# Patient Record
Sex: Female | Born: 1960 | Race: White | Hispanic: No | Marital: Married | Smoking: Former smoker
Health system: Southern US, Community
[De-identification: ages and names within clinical notes are randomized; demographics above are authoritative.]

## PROBLEM LIST (undated history)

## (undated) DIAGNOSIS — T8859XA Other complications of anesthesia, initial encounter: Secondary | ICD-10-CM

## (undated) DIAGNOSIS — K921 Melena: Secondary | ICD-10-CM

## (undated) DIAGNOSIS — T883XXA Malignant hyperthermia due to anesthesia, initial encounter: Secondary | ICD-10-CM

## (undated) DIAGNOSIS — G43909 Migraine, unspecified, not intractable, without status migrainosus: Secondary | ICD-10-CM

## (undated) DIAGNOSIS — Z8489 Family history of other specified conditions: Secondary | ICD-10-CM

## (undated) DIAGNOSIS — Z860100 Personal history of colon polyps, unspecified: Secondary | ICD-10-CM

## (undated) DIAGNOSIS — E041 Nontoxic single thyroid nodule: Secondary | ICD-10-CM

## (undated) DIAGNOSIS — T4145XA Adverse effect of unspecified anesthetic, initial encounter: Secondary | ICD-10-CM

## (undated) DIAGNOSIS — K219 Gastro-esophageal reflux disease without esophagitis: Secondary | ICD-10-CM

## (undated) DIAGNOSIS — C801 Malignant (primary) neoplasm, unspecified: Secondary | ICD-10-CM

## (undated) DIAGNOSIS — F17210 Nicotine dependence, cigarettes, uncomplicated: Secondary | ICD-10-CM

## (undated) DIAGNOSIS — Z87442 Personal history of urinary calculi: Secondary | ICD-10-CM

## (undated) DIAGNOSIS — I10 Essential (primary) hypertension: Secondary | ICD-10-CM

## (undated) DIAGNOSIS — E785 Hyperlipidemia, unspecified: Secondary | ICD-10-CM

## (undated) DIAGNOSIS — E559 Vitamin D deficiency, unspecified: Secondary | ICD-10-CM

## (undated) HISTORY — DX: Melena: K92.1

## (undated) HISTORY — DX: Gastro-esophageal reflux disease without esophagitis: K21.9

## (undated) HISTORY — DX: Vitamin D deficiency, unspecified: E55.9

## (undated) HISTORY — DX: Migraine, unspecified, not intractable, without status migrainosus: G43.909

## (undated) HISTORY — DX: Hyperlipidemia, unspecified: E78.5

## (undated) HISTORY — PX: CHOLECYSTECTOMY: SHX55

## (undated) HISTORY — PX: ESOPHAGOGASTRODUODENOSCOPY: SHX1529

## (undated) HISTORY — PX: LAPAROSCOPIC CHOLECYSTECTOMY: SUR755

---

## 1981-01-08 HISTORY — PX: APPENDECTOMY: SHX54

## 1990-01-08 DIAGNOSIS — C569 Malignant neoplasm of unspecified ovary: Secondary | ICD-10-CM

## 1990-01-08 DIAGNOSIS — C801 Malignant (primary) neoplasm, unspecified: Secondary | ICD-10-CM

## 1990-01-08 HISTORY — DX: Malignant (primary) neoplasm, unspecified: C80.1

## 1990-01-08 HISTORY — DX: Malignant neoplasm of unspecified ovary: C56.9

## 1990-01-08 HISTORY — PX: TOTAL ABDOMINAL HYSTERECTOMY: SHX209

## 2004-01-09 HISTORY — PX: OTHER SURGICAL HISTORY: SHX169

## 2005-04-04 ENCOUNTER — Encounter: Admission: RE | Admit: 2005-04-04 | Discharge: 2005-04-04 | Payer: Self-pay | Admitting: Internal Medicine

## 2005-04-05 ENCOUNTER — Encounter: Admission: RE | Admit: 2005-04-05 | Discharge: 2005-04-05 | Payer: Self-pay | Admitting: Internal Medicine

## 2005-06-26 ENCOUNTER — Other Ambulatory Visit: Admission: RE | Admit: 2005-06-26 | Discharge: 2005-06-26 | Payer: Self-pay | Admitting: Internal Medicine

## 2005-09-01 ENCOUNTER — Emergency Department (HOSPITAL_COMMUNITY): Admission: EM | Admit: 2005-09-01 | Discharge: 2005-09-01 | Payer: Self-pay | Admitting: Emergency Medicine

## 2006-01-08 HISTORY — PX: COLONOSCOPY: SHX174

## 2006-07-31 ENCOUNTER — Encounter: Admission: RE | Admit: 2006-07-31 | Discharge: 2006-07-31 | Payer: Self-pay | Admitting: *Deleted

## 2006-08-29 ENCOUNTER — Ambulatory Visit (HOSPITAL_COMMUNITY): Admission: RE | Admit: 2006-08-29 | Discharge: 2006-08-29 | Payer: Self-pay | Admitting: Gastroenterology

## 2006-08-29 ENCOUNTER — Encounter (INDEPENDENT_AMBULATORY_CARE_PROVIDER_SITE_OTHER): Payer: Self-pay | Admitting: Gastroenterology

## 2010-01-29 ENCOUNTER — Encounter: Payer: Self-pay | Admitting: Internal Medicine

## 2010-05-23 NOTE — Op Note (Signed)
Brenda [name], GLADD NO.:  [id number]   MEDICAL RECORD NO.:  [id number]          PATIENT TYPE:  AMB   LOCATION:  ENDO                         FACILITY:  [facility]   PHYSICIAN:  Petra Kuba, M.D.    DATE OF BIRTH:  04/19/1960   DATE OF PROCEDURE:  08/29/2006  DATE OF DISCHARGE:                               OPERATIVE REPORT   PROCEDURE:  Colonoscopy and biopsy.   INDICATIONS:  Patient with a questionable history of colon polyps,  thought due by primary care for repeat screening.  Consent was signed  after risks, benefits, methods, options thoroughly discussed in the  office and before any sedation.   MEDICINES USED:  Fentanyl 100 mcg, Versed 7.5 mg.   PROCEDURE:  Rectal inspection is pertinent for external hemorrhoids,  small.  Digital exam was negative.  The video pediatric colonoscope was  inserted and with some difficulty due to a tortuous, looping colon with  rolling her back and abdominal pressure was able to be advanced to the  cecum.  No abnormalities were seen on insertion.  The scope was inserted  a short way into the terminal ileum, which was normal.  Photo  documentation was obtained.  The scope was slowly withdrawn.  The prep  was adequate.  There was some liquid stool that required washing and  suctioning.  On slow withdrawal through the colon the cecum, ascending,  transverse, descending and majority the sigmoid were normal.  In the  rectal and distal sigmoid were a few tiny hyperplastic-appearing polyps,  which were cold-biopsied and put in the same container.  Anorectal pull-  through and retroflexion confirmed some small hemorrhoids.  The scope  was straightened and readvanced short way up the left side of the colon.  Air was suctioned, the scope removed.  The patient tolerated the  procedure well.  There was no obvious immediate complication.   ENDOSCOPIC DIAGNOSES:  1. Internal-external small hemorrhoids.  2. Few tiny rectal, sigmoid  hyperplastic-appearing polyps, cold      biopsied.  3. Otherwise within normal limits to the cecum and terminal ileum.   PLAN:  Will await pathology to determine future colonic screening.  We  will need to discuss her previous colonoscopy report and polyp history  with the primary care doctor since her colonoscopy just arrived and did  not show any polyps, based on the one in [place].  Otherwise probably  proceed with colon screening at age 23 and continue workup with an EGD  for her nausea.           ______________________________  Petra Kuba, M.D.     MEM/MEDQ  D:  08/29/2006  T:  08/30/2006  Job:  [id number]   cc:   Bess Kinds, MD

## 2010-05-23 NOTE — Op Note (Signed)
NAMEWILHEMINA, Brenda [name] NO.:  [id number]   MEDICAL RECORD NO.:  [id number]          PATIENT TYPE:  AMB   LOCATION:  ENDO                         FACILITY:  [facility]   PHYSICIAN:  Petra Kuba, M.D.    DATE OF BIRTH:  10-22-1960   DATE OF PROCEDURE:  08/29/2006  DATE OF DISCHARGE:                               OPERATIVE REPORT   PROCEDURE:  EGD with biopsy.   INDICATIONS:  Nausea, some reflux.  Consent was signed after risks,  benefits, methods, and options were thoroughly discussed in the office  and before any medications were given.   Additional medicines for this procedure since it followed the  colonoscopy 2.5 of Versed only.   DESCRIPTION OF PROCEDURE:  The video endoscope was inserted by direct  vision.  The esophagus was normal.  She did have a small to possibly  medium size hiatal hernia.  The scope passed into the stomach.  She had  some difficulty holding in air, advanced through a normal pylorus into a  normal duodenal bulb and around the C-loop to a normal second portion of  the duodenum.  The scope was withdrawn back to the bulb and a good look  there ruled out ulcers in that location.  The scope was withdrawn back  to the stomach.  She had a moderate patch of gastritis in the antrum  which was cold biopsied a few times but otherwise the stomach looked  good on straight and retroflexed visualization including a good look at  the cardia, fundus, angularis, lesser and greater curve. Complete  visualization of the stomach was difficult due to her inability to  completely hold the air. A few proximal biopsies were obtained to rule  out Helicobacter as well.  The scope was then slowly withdrawn again  confirming a normal esophagus.  The scope was removed.  The patient  tolerated the procedure well.  There was no obvious immediate  complication.   ENDOSCOPIC DIAGNOSES:  1. Small to medium size hiatal hernia.  2. Patch of antritis status post biopsy and  proximal gastric biopsies      done to rule out Helicobacter.  3. Otherwise normal EGD.   PLAN:  Await pathology. May need to upgrade pump inhibitors and possibly  even a trial of Carafate. May want to treat her reflux more aggressively  to see if that helps her nausea and further workup and plans either by  coming back to me or per primary care.           ______________________________  Petra Kuba, M.D.     MEM/MEDQ  D:  08/29/2006  T:  08/30/2006  Job:  [id number]   cc:   Annell Greening

## 2010-12-11 ENCOUNTER — Ambulatory Visit
Admission: RE | Admit: 2010-12-11 | Discharge: 2010-12-11 | Disposition: A | Discharge status: Home / Self Care | Payer: BC Managed Care – PPO | Source: Ambulatory Visit | Attending: Family Medicine | Admitting: Family Medicine

## 2010-12-11 ENCOUNTER — Other Ambulatory Visit: Payer: Self-pay | Admitting: Family Medicine

## 2010-12-11 DIAGNOSIS — M549 Dorsalgia, unspecified: Secondary | ICD-10-CM

## 2011-01-23 ENCOUNTER — Other Ambulatory Visit: Payer: Self-pay | Admitting: Family Medicine

## 2011-01-23 DIAGNOSIS — Z1231 Encounter for screening mammogram for malignant neoplasm of breast: Secondary | ICD-10-CM

## 2011-02-13 ENCOUNTER — Ambulatory Visit
Admission: RE | Admit: 2011-02-13 | Discharge: 2011-02-13 | Disposition: A | Discharge status: Home / Self Care | Payer: BC Managed Care – PPO | Source: Ambulatory Visit | Attending: Family Medicine | Admitting: Family Medicine

## 2011-02-13 DIAGNOSIS — Z1231 Encounter for screening mammogram for malignant neoplasm of breast: Secondary | ICD-10-CM

## 2011-11-08 ENCOUNTER — Other Ambulatory Visit: Payer: Self-pay | Admitting: Family Medicine

## 2011-11-08 ENCOUNTER — Ambulatory Visit
Admission: RE | Admit: 2011-11-08 | Discharge: 2011-11-08 | Disposition: A | Discharge status: Home / Self Care | Payer: BC Managed Care – PPO | Source: Ambulatory Visit | Attending: Family Medicine | Admitting: Family Medicine

## 2011-11-08 DIAGNOSIS — R0789 Other chest pain: Secondary | ICD-10-CM

## 2011-11-08 DIAGNOSIS — N644 Mastodynia: Secondary | ICD-10-CM

## 2011-11-08 DIAGNOSIS — R05 Cough: Secondary | ICD-10-CM

## 2011-11-14 ENCOUNTER — Ambulatory Visit
Admission: RE | Admit: 2011-11-14 | Discharge: 2011-11-14 | Disposition: A | Discharge status: Home / Self Care | Payer: BC Managed Care – PPO | Source: Ambulatory Visit | Attending: Family Medicine | Admitting: Family Medicine

## 2011-11-14 DIAGNOSIS — N644 Mastodynia: Secondary | ICD-10-CM

## 2012-05-21 ENCOUNTER — Other Ambulatory Visit: Payer: Self-pay | Admitting: Family Medicine

## 2012-05-21 ENCOUNTER — Other Ambulatory Visit: Payer: Self-pay

## 2012-05-21 DIAGNOSIS — E8940 Asymptomatic postprocedural ovarian failure: Secondary | ICD-10-CM

## 2012-05-21 DIAGNOSIS — Z90710 Acquired absence of both cervix and uterus: Secondary | ICD-10-CM

## 2012-05-29 ENCOUNTER — Other Ambulatory Visit: Payer: BC Managed Care – PPO

## 2012-07-03 ENCOUNTER — Other Ambulatory Visit: Payer: Self-pay

## 2012-07-03 DIAGNOSIS — Z1231 Encounter for screening mammogram for malignant neoplasm of breast: Secondary | ICD-10-CM

## 2012-07-31 ENCOUNTER — Ambulatory Visit
Admission: RE | Admit: 2012-07-31 | Discharge: 2012-07-31 | Disposition: A | Discharge status: Home / Self Care | Payer: BC Managed Care – PPO | Source: Ambulatory Visit | Attending: Family Medicine | Admitting: Family Medicine

## 2012-07-31 ENCOUNTER — Ambulatory Visit
Admission: RE | Admit: 2012-07-31 | Discharge: 2012-07-31 | Disposition: A | Discharge status: Home / Self Care | Payer: BC Managed Care – PPO | Source: Ambulatory Visit

## 2012-07-31 DIAGNOSIS — Z1231 Encounter for screening mammogram for malignant neoplasm of breast: Secondary | ICD-10-CM

## 2012-07-31 DIAGNOSIS — E8940 Asymptomatic postprocedural ovarian failure: Secondary | ICD-10-CM

## 2012-07-31 DIAGNOSIS — Z90710 Acquired absence of both cervix and uterus: Secondary | ICD-10-CM

## 2013-03-30 ENCOUNTER — Other Ambulatory Visit: Payer: BC Managed Care – PPO

## 2013-03-30 ENCOUNTER — Other Ambulatory Visit: Payer: Self-pay | Admitting: Family Medicine

## 2013-03-30 ENCOUNTER — Ambulatory Visit
Admission: RE | Admit: 2013-03-30 | Discharge: 2013-03-30 | Disposition: A | Discharge status: Home / Self Care | Payer: BC Managed Care – PPO | Source: Ambulatory Visit | Attending: Family Medicine | Admitting: Family Medicine

## 2013-03-30 DIAGNOSIS — R319 Hematuria, unspecified: Secondary | ICD-10-CM

## 2013-03-30 DIAGNOSIS — R109 Unspecified abdominal pain: Secondary | ICD-10-CM

## 2013-05-26 ENCOUNTER — Other Ambulatory Visit: Payer: Self-pay | Admitting: Family Medicine

## 2013-05-26 DIAGNOSIS — Z1231 Encounter for screening mammogram for malignant neoplasm of breast: Secondary | ICD-10-CM

## 2013-06-02 ENCOUNTER — Other Ambulatory Visit: Payer: Self-pay | Admitting: Family Medicine

## 2013-06-02 DIAGNOSIS — R0989 Other specified symptoms and signs involving the circulatory and respiratory systems: Secondary | ICD-10-CM

## 2013-06-04 ENCOUNTER — Encounter (INDEPENDENT_AMBULATORY_CARE_PROVIDER_SITE_OTHER): Payer: Self-pay

## 2013-06-04 ENCOUNTER — Ambulatory Visit
Admission: RE | Admit: 2013-06-04 | Discharge: 2013-06-04 | Disposition: A | Discharge status: Home / Self Care | Payer: BC Managed Care – PPO | Source: Ambulatory Visit | Attending: Family Medicine | Admitting: Family Medicine

## 2013-06-04 DIAGNOSIS — R0989 Other specified symptoms and signs involving the circulatory and respiratory systems: Secondary | ICD-10-CM

## 2013-07-02 ENCOUNTER — Encounter: Payer: Self-pay | Admitting: *Deleted

## 2013-07-02 DIAGNOSIS — E785 Hyperlipidemia, unspecified: Secondary | ICD-10-CM

## 2013-07-02 DIAGNOSIS — K219 Gastro-esophageal reflux disease without esophagitis: Secondary | ICD-10-CM | POA: Insufficient documentation

## 2013-07-02 DIAGNOSIS — E559 Vitamin D deficiency, unspecified: Secondary | ICD-10-CM | POA: Insufficient documentation

## 2013-08-03 ENCOUNTER — Ambulatory Visit
Admission: RE | Admit: 2013-08-03 | Discharge: 2013-08-03 | Disposition: A | Discharge status: Home / Self Care | Payer: BC Managed Care – PPO | Source: Ambulatory Visit | Attending: Family Medicine | Admitting: Family Medicine

## 2013-08-03 ENCOUNTER — Encounter (INDEPENDENT_AMBULATORY_CARE_PROVIDER_SITE_OTHER): Payer: Self-pay

## 2013-08-03 DIAGNOSIS — Z1231 Encounter for screening mammogram for malignant neoplasm of breast: Secondary | ICD-10-CM

## 2014-09-21 ENCOUNTER — Other Ambulatory Visit: Payer: Self-pay | Admitting: Family Medicine

## 2014-09-21 DIAGNOSIS — Z1231 Encounter for screening mammogram for malignant neoplasm of breast: Secondary | ICD-10-CM

## 2014-09-21 DIAGNOSIS — M8580 Other specified disorders of bone density and structure, unspecified site: Secondary | ICD-10-CM

## 2014-10-27 ENCOUNTER — Ambulatory Visit: Payer: BC Managed Care – PPO

## 2014-10-27 ENCOUNTER — Other Ambulatory Visit: Payer: BC Managed Care – PPO

## 2014-11-23 ENCOUNTER — Ambulatory Visit
Admission: RE | Admit: 2014-11-23 | Discharge: 2014-11-23 | Disposition: A | Discharge status: Home / Self Care | Payer: BC Managed Care – PPO | Source: Ambulatory Visit | Attending: Family Medicine | Admitting: Family Medicine

## 2014-11-23 DIAGNOSIS — M8580 Other specified disorders of bone density and structure, unspecified site: Secondary | ICD-10-CM

## 2014-11-23 DIAGNOSIS — Z1231 Encounter for screening mammogram for malignant neoplasm of breast: Secondary | ICD-10-CM

## 2014-11-24 ENCOUNTER — Other Ambulatory Visit: Payer: BC Managed Care – PPO

## 2015-10-26 ENCOUNTER — Other Ambulatory Visit: Payer: Self-pay | Admitting: Dermatology

## 2015-10-26 DIAGNOSIS — C4491 Basal cell carcinoma of skin, unspecified: Secondary | ICD-10-CM

## 2015-10-26 HISTORY — DX: Basal cell carcinoma of skin, unspecified: C44.91

## 2016-07-04 ENCOUNTER — Telehealth: Payer: Self-pay

## 2016-07-06 NOTE — Telephone Encounter (Signed)
error 

## 2016-09-25 ENCOUNTER — Other Ambulatory Visit: Payer: Self-pay | Admitting: Nurse Practitioner

## 2016-09-25 DIAGNOSIS — M8580 Other specified disorders of bone density and structure, unspecified site: Secondary | ICD-10-CM

## 2016-09-25 DIAGNOSIS — Z1231 Encounter for screening mammogram for malignant neoplasm of breast: Secondary | ICD-10-CM

## 2016-11-26 ENCOUNTER — Ambulatory Visit
Admission: RE | Admit: 2016-11-26 | Discharge: 2016-11-26 | Disposition: A | Discharge status: Home / Self Care | Payer: BC Managed Care – PPO | Source: Ambulatory Visit | Attending: Nurse Practitioner | Admitting: Nurse Practitioner

## 2016-11-26 DIAGNOSIS — Z1231 Encounter for screening mammogram for malignant neoplasm of breast: Secondary | ICD-10-CM

## 2016-11-26 DIAGNOSIS — M8580 Other specified disorders of bone density and structure, unspecified site: Secondary | ICD-10-CM

## 2016-11-28 ENCOUNTER — Other Ambulatory Visit: Payer: Self-pay | Admitting: Nurse Practitioner

## 2016-11-28 DIAGNOSIS — R928 Other abnormal and inconclusive findings on diagnostic imaging of breast: Secondary | ICD-10-CM

## 2016-12-06 ENCOUNTER — Ambulatory Visit
Admission: RE | Admit: 2016-12-06 | Discharge: 2016-12-06 | Disposition: A | Discharge status: Home / Self Care | Payer: BC Managed Care – PPO | Source: Ambulatory Visit | Attending: Nurse Practitioner | Admitting: Nurse Practitioner

## 2016-12-06 DIAGNOSIS — R928 Other abnormal and inconclusive findings on diagnostic imaging of breast: Secondary | ICD-10-CM

## 2017-03-18 ENCOUNTER — Other Ambulatory Visit: Payer: Self-pay | Admitting: Gastroenterology

## 2017-03-26 ENCOUNTER — Other Ambulatory Visit: Payer: Self-pay

## 2017-03-26 ENCOUNTER — Encounter (HOSPITAL_COMMUNITY): Payer: Self-pay | Admitting: *Deleted

## 2017-03-26 NOTE — Progress Notes (Signed)
Patient stated having diarrhea at time of pre op call x 1 day, stated having lots of grandchildren around thought she had "tummy bug", patient instructed to call dr Watt Climes if diarrhea lasted more than 2 days.

## 2017-03-26 NOTE — Progress Notes (Signed)
Spoke with Brenda [name] surgery scheduling malignant hyperthermia icon added to case by Brenda [name], spoke with dr Rogelio Seen Brenda [name] anesthesia, and notified patient with family history of mother, daughter and niece diagnosed with malignant hyperthermia, patient test 2006 inconclusive 2006, patient ok for colonscopy at 1100 04-08-17 per dr Rogelio Seen Brenda [name].

## 2017-04-08 ENCOUNTER — Other Ambulatory Visit: Payer: Self-pay | Admitting: Gastroenterology

## 2017-04-08 ENCOUNTER — Other Ambulatory Visit: Payer: Self-pay

## 2017-04-08 ENCOUNTER — Encounter (HOSPITAL_COMMUNITY)
Admission: RE | Disposition: A | Discharge status: Home / Self Care | Payer: Self-pay | Source: Ambulatory Visit | Attending: Gastroenterology

## 2017-04-08 ENCOUNTER — Ambulatory Visit (HOSPITAL_COMMUNITY): Payer: BC Managed Care – PPO | Admitting: Certified Registered Nurse Anesthetist

## 2017-04-08 ENCOUNTER — Ambulatory Visit (HOSPITAL_COMMUNITY)
Admission: RE | Admit: 2017-04-08 | Discharge: 2017-04-08 | Disposition: A | Discharge status: Home / Self Care | Payer: BC Managed Care – PPO | Source: Ambulatory Visit | Attending: Gastroenterology | Admitting: Gastroenterology

## 2017-04-08 ENCOUNTER — Encounter (HOSPITAL_COMMUNITY): Payer: Self-pay

## 2017-04-08 DIAGNOSIS — Z87891 Personal history of nicotine dependence: Secondary | ICD-10-CM | POA: Diagnosis not present

## 2017-04-08 DIAGNOSIS — Z8249 Family history of ischemic heart disease and other diseases of the circulatory system: Secondary | ICD-10-CM | POA: Diagnosis not present

## 2017-04-08 DIAGNOSIS — Z8543 Personal history of malignant neoplasm of ovary: Secondary | ICD-10-CM | POA: Insufficient documentation

## 2017-04-08 DIAGNOSIS — Z1211 Encounter for screening for malignant neoplasm of colon: Secondary | ICD-10-CM | POA: Insufficient documentation

## 2017-04-08 DIAGNOSIS — Z79899 Other long term (current) drug therapy: Secondary | ICD-10-CM | POA: Diagnosis not present

## 2017-04-08 DIAGNOSIS — Z791 Long term (current) use of non-steroidal anti-inflammatories (NSAID): Secondary | ICD-10-CM | POA: Insufficient documentation

## 2017-04-08 DIAGNOSIS — K219 Gastro-esophageal reflux disease without esophagitis: Secondary | ICD-10-CM | POA: Insufficient documentation

## 2017-04-08 DIAGNOSIS — K635 Polyp of colon: Secondary | ICD-10-CM | POA: Insufficient documentation

## 2017-04-08 HISTORY — DX: Adverse effect of unspecified anesthetic, initial encounter: T41.45XA

## 2017-04-08 HISTORY — DX: Malignant hyperthermia due to anesthesia, initial encounter: T88.3XXA

## 2017-04-08 HISTORY — DX: Other complications of anesthesia, initial encounter: T88.59XA

## 2017-04-08 HISTORY — PX: COLONOSCOPY WITH PROPOFOL: SHX5780

## 2017-04-08 HISTORY — DX: Family history of other specified conditions: Z84.89

## 2017-04-08 HISTORY — DX: Personal history of urinary calculi: Z87.442

## 2017-04-08 HISTORY — DX: Malignant (primary) neoplasm, unspecified: C80.1

## 2017-04-08 SURGERY — COLONOSCOPY WITH PROPOFOL
Anesthesia: Monitor Anesthesia Care

## 2017-04-08 MED ORDER — ONDANSETRON HCL 4 MG/2ML IJ SOLN
INTRAMUSCULAR | Status: DC | PRN
  Administered 2017-04-08: 4 mg via INTRAVENOUS

## 2017-04-08 MED ORDER — PROPOFOL 500 MG/50ML IV EMUL
INTRAVENOUS | Status: DC | PRN
  Administered 2017-04-08: 140 ug/kg/min via INTRAVENOUS

## 2017-04-08 MED ORDER — PROPOFOL 10 MG/ML IV BOLUS
INTRAVENOUS | Status: AC
  Filled 2017-04-08: qty 40

## 2017-04-08 MED ORDER — LACTATED RINGERS IV SOLN
INTRAVENOUS | Status: DC
  Administered 2017-04-08: 1000 mL via INTRAVENOUS

## 2017-04-08 MED ORDER — PROPOFOL 10 MG/ML IV BOLUS
INTRAVENOUS | Status: AC
  Filled 2017-04-08: qty 20

## 2017-04-08 MED ORDER — LIDOCAINE 2% (20 MG/ML) 5 ML SYRINGE
INTRAMUSCULAR | Status: DC | PRN
  Administered 2017-04-08: 100 mg via INTRAVENOUS

## 2017-04-08 MED ORDER — PROPOFOL 10 MG/ML IV BOLUS
INTRAVENOUS | Status: DC | PRN
  Administered 2017-04-08 (×3): 20 mg via INTRAVENOUS

## 2017-04-08 MED ORDER — SODIUM CHLORIDE 0.9 % IV SOLN: INTRAVENOUS | Status: DC

## 2017-04-08 SURGICAL SUPPLY — 22 items

## 2017-04-08 NOTE — Transfer of Care (Signed)
Immediate Anesthesia Transfer of Care Note  Patient: Brenda [name]  Procedure(s) Performed: COLONOSCOPY WITH PROPOFOL (N/A )  Patient Location: Endoscopy Unit  Anesthesia Type:MAC  Level of Consciousness: awake  Airway & Oxygen Therapy: Patient Spontanous Breathing and Patient connected to face mask oxygen  Post-op Assessment: Report given to RN and Post -op Vital signs reviewed and stable  Post vital signs: Reviewed and stable  Last Vitals:  Vitals Value Taken Time  BP    Temp    Pulse    Resp    SpO2      Last Pain:  Vitals:   04/08/17 1000  TempSrc: Oral  PainSc: 0-No pain         Complications: No apparent anesthesia complications

## 2017-04-08 NOTE — Op Note (Signed)
[facility] Patient Name: Brenda [name] Procedure Date: 04/08/2017 MRN: [id number] Attending MD: Clarene Essex , MD Date of Birth: 1960-05-03 CSN: [id number] Age: 57 Admit Type: Outpatient Procedure:                Colonoscopy Indications:              Screening for colorectal malignant neoplasm, Last                            colonoscopy: August 2008 Providers:                Clarene Essex, MD, Presley Raddle, RN, Elspeth Cho                            Tech., Technician, Danley Danker, CRNA Referring MD:              Medicines:                Propofol total dose 375 mg IV, 100 mg IV lidocaine Complications:            No immediate complications. Estimated Blood Loss:     Estimated blood loss: none. Procedure:                Pre-Anesthesia Assessment:                           - Prior to the procedure, a History and Physical                            was performed, and patient medications and                            allergies were reviewed. The patient's tolerance of                            previous anesthesia was also reviewed. The risks                            and benefits of the procedure and the sedation                            options and risks were discussed with the patient.                            All questions were answered, and informed consent                            was obtained. Prior Anticoagulants: The patient has                            taken no previous anticoagulant or antiplatelet                            agents. ASA Grade Assessment: II - A patient with  mild systemic disease. After reviewing the risks                            and benefits, the patient was deemed in                            satisfactory condition to undergo the procedure.                           After obtaining informed consent, the colonoscope                            was passed under direct vision. Throughout the                     procedure, the patient's blood pressure, pulse, and                            oxygen saturations were monitored continuously. The                            EC-[id number] ([id number]) scope was introduced through                            the anus and advanced to the the terminal ileum.                            The terminal ileum, ileocecal valve, appendiceal                            orifice, and rectum were photographed. The                            colonoscopy was performed without difficulty. The                            patient tolerated the procedure well. The quality                            of the bowel preparation was adequate. Scope In: 12:23:47 PM Scope Out: 19:37:90 PM Scope Withdrawal Time: 0 hours 18 minutes 29 seconds  Total Procedure Duration: 0 hours 23 minutes 9 seconds  Findings:      External and internal hemorrhoids were found during retroflexion, during       perianal exam and during digital exam. The hemorrhoids were small.      Four semi-sessile polyps were found in the mid sigmoid colon and distal       sigmoid colon. The polyps were diminutive in size. These were biopsied       with a cold forceps for histology.      The terminal ileum appeared normal.      The exam was otherwise without abnormality. Impression:               - External and internal hemorrhoids.                           -  Four diminutive polyps in the mid sigmoid colon                            and in the distal sigmoid colon. Biopsied.                           - The examined portion of the ileum was normal.                           - The examination was otherwise normal. Moderate Sedation:      N/A- Per Anesthesia Care Recommendation:           - Patient has a contact number available for                            emergencies. The signs and symptoms of potential                            delayed complications were discussed with the                             patient. Return to normal activities tomorrow.                            Written discharge instructions were provided to the                            patient.                           - Soft diet today.                           - Continue present medications.                           - Await pathology results.                           - Repeat colonoscopy in 5-10 years for surveillance                            based on pathology results.                           - Return to GI office PRN.                           - Telephone GI clinic for pathology results in 1                            week.                           - Telephone GI clinic if symptomatic PRN. Procedure Code(s):        --- Professional ---  45380, Colonoscopy, flexible; with biopsy, single                            or multiple Diagnosis Code(s):        --- Professional ---                           Z12.11, Encounter for screening for malignant                            neoplasm of colon                           D12.5, Benign neoplasm of sigmoid colon CPT copyright 2016 American [employer] Association. All rights reserved. The codes documented in this report are preliminary and upon coder review may  be revised to meet current compliance requirements. Clarene Essex, MD 04/08/2017 12:52:25 PM This report has been signed electronically. Number of Addenda: 0

## 2017-04-08 NOTE — Anesthesia Procedure Notes (Signed)
Date/Time: 04/08/2017 12:14 PM Performed by: Sharlette Dense, CRNA Oxygen Delivery Method: Simple face mask

## 2017-04-08 NOTE — Discharge Instructions (Signed)
Call if question or problem and follow-up as needed otherwise call for biopsy report in 1 week  YOU HAD AN ENDOSCOPIC PROCEDURE TODAY: Refer to the procedure report and other information in the discharge instructions given to you for any specific questions about what was found during the examination. If this information does not answer your questions, please call [facility] office at [phone] to clarify.   YOU SHOULD EXPECT: Some feelings of bloating in the abdomen. Passage of more gas than usual. Walking can help get rid of the air that was put into your GI tract during the procedure and reduce the bloating. If you had a lower endoscopy (such as a colonoscopy or flexible sigmoidoscopy) you may notice spotting of blood in your stool or on the toilet paper. Some abdominal soreness may be present for a day or two, also.  DIET: Your first meal following the procedure should be a light meal and then it is ok to progress to your normal diet. A half-sandwich or bowl of soup is an example of a good first meal. Heavy or fried foods are harder to digest and may make you feel nauseous or bloated. Drink plenty of fluids but you should avoid alcoholic beverages for 24 hours. If you had a esophageal dilation, please see attached instructions for diet.   ACTIVITY: Your care partner should take you home directly after the procedure. You should plan to take it easy, moving slowly for the rest of the day. You can resume normal activity the day after the procedure however YOU SHOULD NOT DRIVE, use power tools, machinery or perform tasks that involve climbing or major physical exertion for 24 hours (because of the sedation medicines used during the test).   SYMPTOMS TO REPORT IMMEDIATELY: A gastroenterologist can be reached at any hour. Please call [phone]  for any of the following symptoms:  Following lower endoscopy (colonoscopy, flexible sigmoidoscopy) Excessive amounts of blood in the stool  Significant  tenderness, worsening of abdominal pains  Swelling of the abdomen that is new, acute  Fever of 100 or higher  Following upper endoscopy (EGD, EUS, ERCP, esophageal dilation) Vomiting of blood or coffee ground material  New, significant abdominal pain  New, significant chest pain or pain under the shoulder blades  Painful or persistently difficult swallowing  New shortness of breath  Black, tarry-looking or red, bloody stools  FOLLOW UP:  If any biopsies were taken you will be contacted by phone or by letter within the next 1-3 weeks. Call [phone]  if you have not heard about the biopsies in 3 weeks.  Please also call with any specific questions about appointments or follow up tests.

## 2017-04-08 NOTE — Anesthesia Preprocedure Evaluation (Signed)
Anesthesia Evaluation  Patient identified by MRN, date of birth, ID band Patient awake    Reviewed: Allergy & Precautions, H&P , NPO status , Patient's Chart, lab work & pertinent test results  History of Anesthesia Complications (+) MALIGNANT HYPERTHERMIA, Family history of anesthesia reaction and history of anesthetic complications  Airway Mallampati: II   Neck ROM: full    Dental   Pulmonary former smoker,    breath sounds clear to auscultation       Cardiovascular negative cardio ROS   Rhythm:regular Rate:Normal     Neuro/Psych  Headaches,    GI/Hepatic GERD  ,  Endo/Other    Renal/GU      Musculoskeletal   Abdominal   Peds  Hematology   Anesthesia Other Findings Family Hx of MH. Pt was tested 2006, results inconclusive.  Reproductive/Obstetrics                             Anesthesia Physical Anesthesia Plan  ASA: II  Anesthesia Plan: MAC   Post-op Pain Management:    Induction: Intravenous  PONV Risk Score and Plan: 2 and Propofol infusion, Ondansetron and Treatment may vary due to age or medical condition  Airway Management Planned: Nasal Cannula  Additional Equipment:   Intra-op Plan:   Post-operative Plan:   Informed Consent: I have reviewed the patients History and Physical, chart, labs and discussed the procedure including the risks, benefits and alternatives for the proposed anesthesia with the patient or authorized representative who has indicated his/her understanding and acceptance.     Plan Discussed with: CRNA, Anesthesiologist and Surgeon  Anesthesia Plan Comments:         Anesthesia Quick Evaluation

## 2017-04-08 NOTE — Progress Notes (Signed)
Brenda [name] 12:05 PM  Subjective: Patient without any GI complaints and her history was reviewed and her previous procedure reviewed  Objective: Vital signs stable afebrile no acute distress exam please see preassessment evaluation  Assessment: Colon cancer screening  Plan: Okay to proceed with colonoscopy with anesthesia assistance  [facility]  Pager [phone] After 5PM or if no answer call [phone]

## 2017-04-09 NOTE — Anesthesia Postprocedure Evaluation (Signed)
Anesthesia Post Note  Patient: Brenda [name]  Procedure(s) Performed: COLONOSCOPY WITH PROPOFOL (N/A )     Patient location during evaluation: Endoscopy Anesthesia Type: MAC Level of consciousness: awake and alert Pain management: pain level controlled Vital Signs Assessment: post-procedure vital signs reviewed and stable Respiratory status: spontaneous breathing, nonlabored ventilation, respiratory function stable and patient connected to nasal cannula oxygen Cardiovascular status: blood pressure returned to baseline and stable Postop Assessment: no apparent nausea or vomiting Anesthetic complications: no    Last Vitals:  Vitals:   04/08/17 1300 04/08/17 1310  BP: 111/63 124/72  Pulse: (!) 57 (!) 54  Resp: 20 12  Temp: (!) 36.3 C   SpO2: 100% 98%    Last Pain:  Vitals:   04/08/17 1310  TempSrc:   PainSc: 0-No pain                 Lora Chavers S

## 2017-12-02 ENCOUNTER — Other Ambulatory Visit: Payer: Self-pay | Admitting: Nurse Practitioner

## 2017-12-02 ENCOUNTER — Other Ambulatory Visit: Payer: Self-pay | Admitting: Dermatology

## 2017-12-02 DIAGNOSIS — Z1231 Encounter for screening mammogram for malignant neoplasm of breast: Secondary | ICD-10-CM

## 2017-12-09 ENCOUNTER — Ambulatory Visit
Admission: RE | Admit: 2017-12-09 | Discharge: 2017-12-09 | Disposition: A | Discharge status: Home / Self Care | Payer: BC Managed Care – PPO | Source: Ambulatory Visit | Attending: Nurse Practitioner | Admitting: Nurse Practitioner

## 2017-12-09 DIAGNOSIS — Z1231 Encounter for screening mammogram for malignant neoplasm of breast: Secondary | ICD-10-CM

## 2018-11-14 ENCOUNTER — Other Ambulatory Visit: Payer: Self-pay | Admitting: Internal Medicine

## 2018-11-14 DIAGNOSIS — Z1231 Encounter for screening mammogram for malignant neoplasm of breast: Secondary | ICD-10-CM

## 2019-01-07 ENCOUNTER — Ambulatory Visit
Admission: RE | Admit: 2019-01-07 | Discharge: 2019-01-07 | Disposition: A | Discharge status: Home / Self Care | Payer: BC Managed Care – PPO | Source: Ambulatory Visit | Attending: Internal Medicine | Admitting: Internal Medicine

## 2019-01-07 ENCOUNTER — Other Ambulatory Visit: Payer: Self-pay

## 2019-01-07 DIAGNOSIS — Z1231 Encounter for screening mammogram for malignant neoplasm of breast: Secondary | ICD-10-CM

## 2019-04-27 ENCOUNTER — Other Ambulatory Visit: Payer: Self-pay

## 2019-04-27 ENCOUNTER — Encounter (INDEPENDENT_AMBULATORY_CARE_PROVIDER_SITE_OTHER): Payer: Self-pay

## 2019-04-27 ENCOUNTER — Ambulatory Visit: Payer: BC Managed Care – PPO | Admitting: Dermatology

## 2019-04-27 ENCOUNTER — Encounter: Payer: Self-pay | Admitting: Dermatology

## 2019-04-27 ENCOUNTER — Encounter: Payer: Self-pay | Admitting: *Deleted

## 2019-04-27 DIAGNOSIS — D492 Neoplasm of unspecified behavior of bone, soft tissue, and skin: Secondary | ICD-10-CM

## 2019-04-27 DIAGNOSIS — Z85828 Personal history of other malignant neoplasm of skin: Secondary | ICD-10-CM | POA: Diagnosis not present

## 2019-04-27 DIAGNOSIS — D485 Neoplasm of uncertain behavior of skin: Secondary | ICD-10-CM

## 2019-04-27 NOTE — Patient Instructions (Addendum)

## 2019-04-29 ENCOUNTER — Encounter: Payer: Self-pay | Admitting: Dermatology

## 2019-04-29 NOTE — Progress Notes (Signed)
   Follow-Up Visit   Subjective  Brenda [name] is a 59 y.o. female who presents for the following: Skin Problem (right outer hand-5 months- removed before-growing).  Growth Location: Right hand Duration: Several months Quality: Crease size Associated Signs/Symptoms: Modifying Factors: Recurrent Severity:  Timing: Context: History of skin cancer  The following portions of the chart were reviewed this encounter and updated as appropriate: Tobacco  Allergies  Meds  Problems  Med Hx  Surg Hx  Fam Hx      Objective  Well appearing patient in no apparent distress; mood and affect are within normal limits.  A focused examination was performed including arms, head, neck, back, hands. Relevant physical exam findings are noted in the Assessment and Plan.   Assessment & Plan  Neoplasm of skin Right Dorsal Hand  Skin / nail biopsy Type of biopsy: tangential   Anesthesia: the lesion was anesthetized in a standard fashion   Anesthetic:  1% lidocaine w/ epinephrine 1-100,000 local infiltration Instrument used: flexible razor blade   Hemostasis achieved with: ferric subsulfate   Outcome: patient tolerated procedure well   Post-procedure details: sterile dressing applied and wound care instructions given   Dressing type: petrolatum   Additional details:  Patient identified lesion of concern.  Lesion identified by physician.  Specimen 1 - Surgical pathology Differential Diagnosis: r/o wart Check Margins: No  Light C&D base.

## 2019-07-29 ENCOUNTER — Other Ambulatory Visit: Payer: Self-pay

## 2019-07-29 ENCOUNTER — Encounter: Payer: Self-pay | Admitting: Dermatology

## 2019-07-29 ENCOUNTER — Ambulatory Visit: Payer: BC Managed Care – PPO | Admitting: Dermatology

## 2019-07-29 DIAGNOSIS — D485 Neoplasm of uncertain behavior of skin: Secondary | ICD-10-CM

## 2019-07-29 NOTE — Patient Instructions (Signed)

## 2019-09-02 ENCOUNTER — Encounter: Payer: Self-pay | Admitting: Dermatology

## 2019-09-02 NOTE — Progress Notes (Signed)
   Follow-Up Visit   Subjective  Brenda [name] is a 59 y.o. female who presents for the following: Warts (right hand- " came back " ).  Growth right hand Location:  Duration:  Quality:  Associated Signs/Symptoms: Modifying Factors:  Severity:  Timing: Context: Patient strongly desires removal  Objective  Well appearing patient in no apparent distress; mood and affect are within normal limits.  A focused examination was performed including Arms and hands.. Relevant physical exam findings are noted in the Assessment and Plan.   Assessment & Plan    Neoplasm of uncertain behavior of skin Right Dorsal Hand  Skin / nail biopsy Type of biopsy: tangential   Informed consent: discussed and consent obtained   Timeout: patient name, date of birth, surgical site, and procedure verified   Procedure prep:  Patient was prepped and draped in usual sterile fashion Prep type:  Chlorhexidine Anesthesia: the lesion was anesthetized in a standard fashion   Anesthetic:  1% lidocaine w/ epinephrine 1-100,000 local infiltration Instrument used: flexible razor blade   Hemostasis achieved with: ferric subsulfate   Outcome: patient tolerated procedure well   Post-procedure details: wound care instructions given    Specimen 1 - Surgical pathology Differential Diagnosis: wart Check Margins: No  Multiple treatment options discussed including leaving lesion to be if it is stable.  This growth is very bothersome to choline and she strongly desires removal.  I explained that even with treatment of the base, recurrence is not rare and there is a risk of permanent scar.  She did want to proceed to have the lesion removed.     I, Brenda Monarch, MD, have reviewed all documentation for this visit.  The documentation on 09/02/19 for the exam, diagnosis, procedures, and orders are all accurate and complete.

## 2019-11-02 ENCOUNTER — Ambulatory Visit: Payer: BC Managed Care – PPO | Admitting: Dermatology

## 2019-11-02 ENCOUNTER — Other Ambulatory Visit: Payer: Self-pay

## 2019-11-02 ENCOUNTER — Encounter: Payer: Self-pay | Admitting: Dermatology

## 2019-11-02 DIAGNOSIS — B079 Viral wart, unspecified: Secondary | ICD-10-CM | POA: Diagnosis not present

## 2019-11-02 DIAGNOSIS — L309 Dermatitis, unspecified: Secondary | ICD-10-CM | POA: Diagnosis not present

## 2019-11-02 DIAGNOSIS — Z85828 Personal history of other malignant neoplasm of skin: Secondary | ICD-10-CM

## 2019-11-02 MED ORDER — HALCINONIDE 0.1 % EX CREA: 1.0000 "application " | TOPICAL_CREAM | Freq: Every day | CUTANEOUS | 0 refills | Status: DC

## 2019-11-04 ENCOUNTER — Encounter: Payer: Self-pay | Admitting: Dermatology

## 2019-11-04 NOTE — Progress Notes (Signed)
   Follow-Up Visit   Subjective  Brenda [name] is a 59 y.o. female who presents for the following: Follow-up (right hand pathology showed wart. Lesion is coming back. also check spot left leg dark area. Started out as bug bite wont go away. ).  Wart Location:  Duration:  Quality:  Associated Signs/Symptoms: Modifying Factors:  Severity:  Timing: Context:   Objective  Well appearing patient in no apparent distress; mood and affect are within normal limits.  A focused examination was performed including Head, neck, arms, legs.. Relevant physical exam findings are noted in the Assessment and Plan.   Assessment & Plan    History of basal cell carcinoma (BCC) of skin Left Thigh - Anterior  Treatment after biopsy-2017.  Annual skin check.  Viral warts, unspecified type Right Dorsal Hand  Destruction of lesion - Right Dorsal Hand Complexity: simple   Destruction method: cryotherapy   Informed consent: discussed and consent obtained   Timeout:  patient name, date of birth, surgical site, and procedure verified Lesion destroyed using liquid nitrogen: Yes   Cryotherapy cycles:  5 Outcome: patient tolerated procedure well with no complications   Post-procedure details: wound care instructions given    Dermatitis Left Lower Leg - Posterior  Gave patient x 2 samples of halog cream.  Discussed intralesional triamcinolone but will hold for now.  Halcinonide (HALOG) 0.1 % CREA - Left Lower Leg - Posterior     I, Lavonna Monarch, MD, have reviewed all documentation for this visit.  The documentation on 11/04/19 for the exam, diagnosis, procedures, and orders are all accurate and complete.

## 2020-02-11 ENCOUNTER — Other Ambulatory Visit: Payer: Self-pay | Admitting: Dermatology

## 2020-02-11 DIAGNOSIS — Z1231 Encounter for screening mammogram for malignant neoplasm of breast: Secondary | ICD-10-CM

## 2020-03-31 ENCOUNTER — Ambulatory Visit
Admission: RE | Admit: 2020-03-31 | Discharge: 2020-03-31 | Disposition: A | Discharge status: Home / Self Care | Payer: BC Managed Care – PPO | Source: Ambulatory Visit | Attending: Dermatology | Admitting: Dermatology

## 2020-03-31 ENCOUNTER — Other Ambulatory Visit: Payer: Self-pay

## 2020-03-31 DIAGNOSIS — Z1231 Encounter for screening mammogram for malignant neoplasm of breast: Secondary | ICD-10-CM

## 2020-11-22 ENCOUNTER — Other Ambulatory Visit: Payer: Self-pay | Admitting: Family Medicine

## 2020-11-22 DIAGNOSIS — M8580 Other specified disorders of bone density and structure, unspecified site: Secondary | ICD-10-CM

## 2020-11-22 DIAGNOSIS — Z1231 Encounter for screening mammogram for malignant neoplasm of breast: Secondary | ICD-10-CM

## 2021-05-08 ENCOUNTER — Ambulatory Visit
Admission: RE | Admit: 2021-05-08 | Discharge: 2021-05-08 | Disposition: A | Discharge status: Home / Self Care | Payer: BC Managed Care – PPO | Source: Ambulatory Visit | Attending: Family Medicine | Admitting: Family Medicine

## 2021-05-08 DIAGNOSIS — M8580 Other specified disorders of bone density and structure, unspecified site: Secondary | ICD-10-CM

## 2021-05-08 DIAGNOSIS — Z1231 Encounter for screening mammogram for malignant neoplasm of breast: Secondary | ICD-10-CM

## 2022-03-30 ENCOUNTER — Other Ambulatory Visit: Payer: Self-pay | Admitting: Family Medicine

## 2022-03-30 DIAGNOSIS — Z1231 Encounter for screening mammogram for malignant neoplasm of breast: Secondary | ICD-10-CM

## 2022-05-23 ENCOUNTER — Ambulatory Visit
Admission: RE | Admit: 2022-05-23 | Discharge: 2022-05-23 | Disposition: A | Discharge status: Home / Self Care | Payer: BC Managed Care – PPO | Source: Ambulatory Visit | Attending: Family Medicine | Admitting: Family Medicine

## 2022-05-23 DIAGNOSIS — Z1231 Encounter for screening mammogram for malignant neoplasm of breast: Secondary | ICD-10-CM

## 2023-02-05 DIAGNOSIS — Z1231 Encounter for screening mammogram for malignant neoplasm of breast: Secondary | ICD-10-CM

## 2023-02-06 ENCOUNTER — Other Ambulatory Visit: Payer: Self-pay | Admitting: Internal Medicine

## 2023-02-06 DIAGNOSIS — M8580 Other specified disorders of bone density and structure, unspecified site: Secondary | ICD-10-CM

## 2023-03-01 ENCOUNTER — Other Ambulatory Visit: Payer: Self-pay | Admitting: Internal Medicine

## 2023-03-01 DIAGNOSIS — E041 Nontoxic single thyroid nodule: Secondary | ICD-10-CM

## 2023-03-05 ENCOUNTER — Other Ambulatory Visit (HOSPITAL_COMMUNITY)
Admission: RE | Admit: 2023-03-05 | Discharge: 2023-03-05 | Disposition: A | Discharge status: Home / Self Care | Payer: 59 | Source: Ambulatory Visit | Attending: Interventional Radiology | Admitting: Interventional Radiology

## 2023-03-05 ENCOUNTER — Ambulatory Visit
Admission: RE | Admit: 2023-03-05 | Discharge: 2023-03-05 | Disposition: A | Discharge status: Home / Self Care | Payer: Self-pay | Source: Ambulatory Visit | Attending: Internal Medicine | Admitting: Internal Medicine

## 2023-03-05 DIAGNOSIS — E041 Nontoxic single thyroid nodule: Secondary | ICD-10-CM | POA: Diagnosis present

## 2023-03-06 ENCOUNTER — Ambulatory Visit (HOSPITAL_COMMUNITY)
Admission: RE | Admit: 2023-03-06 | Discharge: 2023-03-06 | Disposition: A | Discharge status: Home / Self Care | Payer: 59 | Source: Ambulatory Visit | Attending: Internal Medicine | Admitting: Internal Medicine

## 2023-03-06 DIAGNOSIS — M8580 Other specified disorders of bone density and structure, unspecified site: Secondary | ICD-10-CM | POA: Insufficient documentation

## 2023-03-07 LAB — CYTOLOGY - NON PAP

## 2023-04-10 ENCOUNTER — Other Ambulatory Visit: Payer: Self-pay | Admitting: Family Medicine

## 2023-04-10 DIAGNOSIS — Z1231 Encounter for screening mammogram for malignant neoplasm of breast: Secondary | ICD-10-CM

## 2023-04-29 ENCOUNTER — Other Ambulatory Visit: Payer: Self-pay | Admitting: Otolaryngology

## 2023-04-29 DIAGNOSIS — R1310 Dysphagia, unspecified: Secondary | ICD-10-CM

## 2023-04-30 ENCOUNTER — Ambulatory Visit
Admission: RE | Admit: 2023-04-30 | Discharge: 2023-04-30 | Disposition: A | Discharge status: Home / Self Care | Source: Ambulatory Visit | Attending: Otolaryngology | Admitting: Otolaryngology

## 2023-04-30 DIAGNOSIS — R1310 Dysphagia, unspecified: Secondary | ICD-10-CM | POA: Insufficient documentation

## 2023-04-30 NOTE — Progress Notes (Addendum)
 Modified Barium Swallow Study  Patient Details  Name: Brenda [name] MRN: [id number] Date of Birth: [birth year]  Today's Date: 04/30/2023  Modified Barium Swallow completed.  Full report located under Chart Review in the Imaging Section.  History of Present Illness Pt is a 63 yo female w/ PMH including GERD, migraines, newly dx'd thyroid  nodule("they are going to remove it" per pt), Basal cell carcinoma, Cancer history.  Pt has not had a CXR (in chart) for 10 years+.    Pt denied dxs of neurological disease and pneumonia; no weight loss.   She eats a Regular diet at home but stated "some foods" (meats, breads) are more difficult to swallow d/t the bulky nature of them (indicating swallowing impact in the area of the thyroid  nodule).   Pt is Missing Lower Molars/Dentition.   Clinical Impression Patient appears to present with functional oropharyngeal phase swallowing; WFL, w/ no no overt altered/varied anatomical presentation (potential impact from thyroid  nodule). NO aspiration nor laryngeal penetration of liquid/food material into the airway noted during study; appropriate clearing of all consistencies.   Pt is being followed/tx'd by ENT for Thyroid  Nodule.   Oral phase is c/b adequate lip closure, bolus preparation and containment, mastication, and anterior to posterior transit. Oral clearing WFL. Swallow initiation occurs at the level of the BOT.  Pharyngeal phase is noted for adequate tongue base retraction, adequate hyolaryngeal excursion, and adequate pharyngeal constriction. Epiglottic deflection is complete; there was No penetration nor aspiration. There was no pharyngeal phase residue noted post swallow. Pharyngeal stripping wave is complete.  Amplitude/duration of cricopharyngeus opening is WFL. There was adequate/complete clearance through the Cervical Esophagus. No bolus residue remined in the (viewable) Cervcial area.   A 13 mm Barium tablet was given Whole in Puree d/t pt's c/o  "some" difficulty swallowing large Pills at home; pt cleared the tablet appropriately w/ a single swallow.     Consistencies tested were thin liquids x2 tsps, 1 cup sip, 3 sequential sips, nectar x1 tsp, 1 cup sip, 2 sequential cup sips, honey x1 tsp, pudding x1 tsp, regular solid (1/2 graham cracker with pudding), and 13 mm barium tablet with puree.  Pt reported a mild Globus sensation post puree trial -- pt "pointed" to the area felt which appeared at/around the thyroid  notch, but No bolus residue was viewed.     Recommend patient continue regular diet with thin liquids; educated pt verbally re: supportive strategies including chopping/cutting and moistening meats and dry foods, alternating solids and liquids, and avoiding problematic foods.  No further ST indicated. Factors that may increase risk of adverse event in presence of aspiration Roderick Civatte & Jessy Morocco 2021):  (n/a)   Swallow Evaluation Recommendations Recommendations: PO diet PO Diet Recommendation: Regular;Thin liquids (Level 0) (cut/chop and moisten meats and dry foods; avoid problematic foods) Liquid Administration via: Cup Medication Administration: Whole meds with puree (for ease) Supervision: Patient able to self-feed Swallowing strategies  : Minimize environmental distractions;Slow rate;Small bites/sips;Follow solids with liquids Postural changes: Position pt fully upright for meals;Stay upright 30-60 min after meals (GERD precautions) Oral care recommendations: Oral care BID (2x/day);Pt independent with oral care Recommended consults: Consider ENT consultation;Consider GI consultation (for management/tx)       Darla Edward, MS, CCC-SLP Speech Language Pathologist Rehab Services; [facility] [phone] (ascom) Tequan Redmon 04/30/2023,5:55 PM

## 2023-05-13 ENCOUNTER — Other Ambulatory Visit (HOSPITAL_COMMUNITY): Payer: Self-pay

## 2023-05-23 ENCOUNTER — Other Ambulatory Visit: Payer: Self-pay | Admitting: Otolaryngology

## 2023-06-10 ENCOUNTER — Ambulatory Visit
Admission: RE | Admit: 2023-06-10 | Discharge: 2023-06-10 | Disposition: A | Discharge status: Home / Self Care | Source: Ambulatory Visit | Attending: Family Medicine | Admitting: Family Medicine

## 2023-06-10 DIAGNOSIS — Z1231 Encounter for screening mammogram for malignant neoplasm of breast: Secondary | ICD-10-CM

## 2023-06-12 ENCOUNTER — Encounter: Payer: Self-pay | Admitting: Otolaryngology

## 2023-06-12 ENCOUNTER — Encounter
Admission: RE | Admit: 2023-06-12 | Discharge: 2023-06-12 | Disposition: A | Discharge status: Home / Self Care | Source: Ambulatory Visit | Attending: Otolaryngology | Admitting: Otolaryngology

## 2023-06-12 ENCOUNTER — Other Ambulatory Visit: Payer: Self-pay

## 2023-06-12 VITALS — Ht 65.0 in | Wt 192.0 lb

## 2023-06-12 DIAGNOSIS — Z01810 Encounter for preprocedural cardiovascular examination: Secondary | ICD-10-CM

## 2023-06-12 DIAGNOSIS — E785 Hyperlipidemia, unspecified: Secondary | ICD-10-CM

## 2023-06-12 DIAGNOSIS — Z01812 Encounter for preprocedural laboratory examination: Secondary | ICD-10-CM

## 2023-06-12 DIAGNOSIS — I10 Essential (primary) hypertension: Secondary | ICD-10-CM

## 2023-06-12 HISTORY — DX: Nontoxic single thyroid nodule: E04.1

## 2023-06-12 HISTORY — DX: Essential (primary) hypertension: I10

## 2023-06-12 HISTORY — DX: Nicotine dependence, cigarettes, uncomplicated: F17.210

## 2023-06-12 HISTORY — DX: Personal history of colon polyps, unspecified: Z86.0100

## 2023-06-12 NOTE — Patient Instructions (Addendum)
 Your procedure is scheduled on:06-18-23 Tuesday Report to the Registration Desk on the 1st floor of the Medical Mall.Then proceed to the 2nd floor Surgery Desk To find out your arrival time, please call [phone] between 1PM - 3PM on:06-17-23 Monday If your arrival time is 6:00 am, do not arrive before that time as the Medical Mall entrance doors do not open until 6:00 am.  REMEMBER: Instructions that are not followed completely may result in serious medical risk, up to and including death; or upon the discretion of your surgeon and anesthesiologist your surgery may need to be rescheduled.  Do not eat food after midnight the night before surgery.  No gum chewing or hard candies.  You may however, drink CLEAR liquids up to 2 hours before you are scheduled to arrive for your surgery. Do not drink anything within 2 hours of your scheduled arrival time.  Clear liquids include: - water  - apple juice without pulp - gatorade (not RED colors) - black coffee or tea (Do NOT add milk or creamers to the coffee or tea) Do NOT drink anything that is not on this list.  One week prior to surgery:Stop NOW (06-12-23) Stop Anti-inflammatories (NSAIDS) such as Advil, Aleve, Ibuprofen, Motrin, Naproxen, Naprosyn and Aspirin based products such as Excedrin, Goody's Powder, BC Powder. Stop ANY OVER THE COUNTER supplements until after surgery (Vitamin D3-K2, Vitamin B12) Continue your Calcium up until the day prior to surgery  You may however, continue to take Tylenol if needed for pain up until the day of surgery.  Continue taking all of your other prescription medications up until the day of surgery.  ON THE DAY OF SURGERY ONLY TAKE THESE MEDICATIONS WITH SIPS OF WATER: -esomeprazole (NEXIUM)  -propranolol (INDERAL)   No Alcohol for 24 hours before or after surgery.  No Smoking including e-cigarettes for 24 hours before surgery.  No chewable tobacco products for at least 6 hours before surgery.  No  nicotine patches on the day of surgery.  Do not use any "recreational" drugs for at least a week (preferably 2 weeks) before your surgery.  Please be advised that the combination of cocaine and anesthesia may have negative outcomes, up to and including death. If you test positive for cocaine, your surgery will be cancelled.  On the morning of surgery brush your teeth with toothpaste and water, you may rinse your mouth with mouthwash if you wish. Do not swallow any toothpaste or mouthwash.  Use CHG Soap as directed on instruction sheet.  Do not wear jewelry, make-up, hairpins, clips or nail polish.  For welded (permanent) jewelry: bracelets, anklets, waist bands, etc.  Please have this removed prior to surgery.  If it is not removed, there is a chance that hospital personnel will need to cut it off on the day of surgery.  Do not wear lotions, powders, or perfumes.   Do not shave body hair from the neck down 48 hours before surgery.  Contact lenses, hearing aids and dentures may not be worn into surgery.  Do not bring valuables to the hospital. [facility] is not responsible for any missing/lost belongings or valuables.   Notify your doctor if there is any change in your medical condition (cold, fever, infection).  Wear comfortable clothing (specific to your surgery type) to the hospital.  After surgery, you can help prevent lung complications by doing breathing exercises.  Take deep breaths and cough every 1-2 hours. Your doctor may order a device called an Facilities manager  to help you take deep breaths. When coughing or sneezing, hold a pillow firmly against your incision with both hands. This is called "splinting." Doing this helps protect your incision. It also decreases belly discomfort.  If you are being admitted to the hospital overnight, leave your suitcase in the car. After surgery it may be brought to your room.  In case of increased patient census, it may be necessary  for you, the patient, to continue your postoperative care in the Same Day Surgery department.  If you are being discharged the day of surgery, you will not be allowed to drive home. You will need a responsible individual to drive you home and stay with you for 24 hours after surgery.   If you are taking public transportation, you will need to have a responsible individual with you.  Please call the Pre-admissions Testing Dept. at [phone] if you have any questions about these instructions.  Surgery Visitation Policy:  Patients having surgery or a procedure may have two visitors.  Children under the age of 65 must have an adult with them who is not the patient.  Inpatient Visitation:    Visiting hours are 7 a.m. to 8 p.m. Up to four visitors are allowed at one time in a patient room. The visitors may rotate out with other people during the day.  One visitor age 50 or older may stay with the patient overnight and must be in the room by 8 p.m.     Preparing for Surgery with CHLORHEXIDINE GLUCONATE (CHG) Soap  Chlorhexidine Gluconate (CHG) Soap  o An antiseptic cleaner that kills germs and bonds with the skin to continue killing germs even after washing  o Used for showering the night before surgery and morning of surgery  Before surgery, you can play an important role by reducing the number of germs on your skin.  CHG (Chlorhexidine gluconate) soap is an antiseptic cleanser which kills germs and bonds with the skin to continue killing germs even after washing.  Please do not use if you have an allergy to CHG or antibacterial soaps. If your skin becomes reddened/irritated stop using the CHG.  1. Shower the NIGHT BEFORE SURGERY and the MORNING OF SURGERY with CHG soap.  2. If you choose to wash your hair, wash your hair first as usual with your normal shampoo.  3. After shampooing, rinse your hair and body thoroughly to remove the shampoo.  4. Use CHG as you would any other  liquid soap. You can apply CHG directly to the skin and wash gently with a scrungie or a clean washcloth.  5. Apply the CHG soap to your body only from the neck down. Do not use on open wounds or open sores. Avoid contact with your eyes, ears, mouth, and genitals (private parts). Wash face and genitals (private parts) with your normal soap.  6. Wash thoroughly, paying special attention to the area where your surgery will be performed.  7. Thoroughly rinse your body with warm water.  8. Do not shower/wash with your normal soap after using and rinsing off the CHG soap.  9. Pat yourself dry with a clean towel.  10. Wear clean pajamas to bed the night before surgery.  12. Place clean sheets on your bed the night of your first shower and do not sleep with pets.  13. Shower again with the CHG soap on the day of surgery prior to arriving at the hospital.  14. Do not apply any deodorants/lotions/powders.  15. Please wear clean clothes to the hospital.

## 2023-06-12 NOTE — Progress Notes (Signed)
  Perioperative Services Pre-Admission/Anesthesia Testing    Date: 06/12/23  Name: ANDE THERRELL MRN:   [id number]  Re: History of familial MALIGNANT HYPERTHERMIA   Planned Surgical Procedure(s):    Case: [id number] Date/Time: 06/18/23 0715   Procedure: THYROIDECTOMY - Thyroid  isthmusectomy possible hemithyroidectomy with laryngeal nerve monitoring   Anesthesia type: General   Diagnosis:      Thyroid  nodule [E04.1]     Globus sensation [R09.A2]     Dysphagia, pharyngoesophageal [R13.14]   Pre-op diagnosis:      THYROID  NODULE      GLOBUS PHARYNGUS     DYSPHAGIA   Location: [facility] OR ROOM 09 / [facility] FOR AN[facility]   Surgeons: Rogers Clayman, MD   Clinical Notes:  Patient scheduled for the above procedure on 06/18/2023 with Dr. Rogers Clayman, MD. During her PAT interview, it was noted that patient has a (+) familial history of MALIGNANT HYPERTHERMIA in multiple 1st and 2nd degree relatives (mother, daughter, niece). She notes that testing was attempted in 2006 during her cholecystectomy. Apparently the sample that was taken from her abdomen for the biopsy did not muscle tissue, thus testing was "inconclusive".  Despite having multiple family members who have suffered the effects of this anesthetic complication, patient refuses to allow muscle biopsy to be repeated for confirmatory caffeine halothane contracture testing (CHCT).  Patient [facility] Melven Stable Pesce) has never personally experienced any issues related to the use of general anesthesia per her report.  Within the [facility] system, patient has undergone a routine colonoscopy at [facility] with no documented complications; MAC anesthetic course using propofol  only. Patient has never received anesthesia services here at [facility]. All of her previous actual surgical procedures have been in [place] .   In efforts to ensure procedural safety during anesthesia administration, this  information was added to patient's medical history for review by the patient's surgical/anesthesia team.  Additionally, necessary OR administrative staff has been made aware so that any appropriate/necessary precautions can be taken prior to the patient arriving to campus for her  planned surgical procedure.  Staff notified: [x]  Lajuan Pila, Mudlogger - OR) [x]  Racheal Buddle, CRNA (Lead CRNA) []  Marlys Singh, RN Engineer, manufacturing systems - OR)  Renate Caroline, MSN, APRN, FNP-C, CEN [facility]  Perioperative Services Nurse Practitioner Phone: [phone] 06/12/23 4:09 PM  NOTE: This note has been prepared using Dragon dictation software. Despite my best ability to proofread, there is always the potential that unintentional transcriptional errors may still occur from this process.

## 2023-06-13 ENCOUNTER — Encounter
Admission: RE | Admit: 2023-06-13 | Discharge: 2023-06-13 | Disposition: A | Discharge status: Home / Self Care | Source: Ambulatory Visit | Attending: Otolaryngology | Admitting: Otolaryngology

## 2023-06-13 DIAGNOSIS — E785 Hyperlipidemia, unspecified: Secondary | ICD-10-CM | POA: Insufficient documentation

## 2023-06-13 DIAGNOSIS — Z01810 Encounter for preprocedural cardiovascular examination: Secondary | ICD-10-CM | POA: Diagnosis not present

## 2023-06-13 DIAGNOSIS — Z01818 Encounter for other preprocedural examination: Secondary | ICD-10-CM | POA: Diagnosis present

## 2023-06-13 DIAGNOSIS — I10 Essential (primary) hypertension: Secondary | ICD-10-CM | POA: Diagnosis not present

## 2023-06-13 DIAGNOSIS — Z01812 Encounter for preprocedural laboratory examination: Secondary | ICD-10-CM

## 2023-06-13 LAB — BASIC METABOLIC PANEL WITH GFR
Anion gap: 7 (ref 5–15)
BUN: 14 mg/dL (ref 8–23)
CO2: 25 mmol/L (ref 22–32)
Calcium: 8.8 mg/dL — ABNORMAL LOW (ref 8.9–10.3)
Chloride: 106 mmol/L (ref 98–111)
Creatinine, Ser: 0.79 mg/dL (ref 0.44–1.00)
GFR, Estimated: >60 mL/min (ref >60)
Glucose, Bld: 106 mg/dL — ABNORMAL HIGH (ref 70–99)
Potassium: 3.8 mmol/L (ref 3.5–5.1)
Sodium: 138 mmol/L (ref 135–145)

## 2023-06-13 LAB — CBC
HCT: 40.1 % (ref 36.0–46.0)
Hemoglobin: 13 g/dL (ref 12.0–15.0)
MCH: 30.5 pg (ref 26.0–34.0)
MCHC: 32.4 g/dL (ref 30.0–36.0)
MCV: 94.1 fL (ref 80.0–100.0)
Platelets: 175 10*3/uL (ref 150–400)
RBC: 4.26 MIL/uL (ref 3.87–5.11)
RDW: 13.7 % (ref 11.5–15.5)
WBC: 9.2 10*3/uL (ref 4.0–10.5)
nRBC: 0 % (ref 0.0–0.2)

## 2023-06-18 ENCOUNTER — Encounter: Payer: Self-pay | Admitting: Otolaryngology

## 2023-06-18 ENCOUNTER — Encounter
Admission: RE | Disposition: A | Discharge status: Home / Self Care | Payer: Self-pay | Source: Home / Self Care | Attending: Otolaryngology

## 2023-06-18 ENCOUNTER — Ambulatory Visit
Admission: RE | Admit: 2023-06-18 | Discharge: 2023-06-18 | Disposition: A | Discharge status: Home / Self Care | Attending: Otolaryngology | Admitting: Otolaryngology

## 2023-06-18 ENCOUNTER — Ambulatory Visit: Payer: Self-pay | Admitting: Urgent Care

## 2023-06-18 ENCOUNTER — Other Ambulatory Visit: Payer: Self-pay

## 2023-06-18 DIAGNOSIS — K219 Gastro-esophageal reflux disease without esophagitis: Secondary | ICD-10-CM | POA: Diagnosis not present

## 2023-06-18 DIAGNOSIS — D34 Benign neoplasm of thyroid gland: Secondary | ICD-10-CM | POA: Diagnosis not present

## 2023-06-18 DIAGNOSIS — F17200 Nicotine dependence, unspecified, uncomplicated: Secondary | ICD-10-CM | POA: Diagnosis not present

## 2023-06-18 DIAGNOSIS — I10 Essential (primary) hypertension: Secondary | ICD-10-CM | POA: Diagnosis not present

## 2023-06-18 DIAGNOSIS — E041 Nontoxic single thyroid nodule: Secondary | ICD-10-CM | POA: Diagnosis present

## 2023-06-18 DIAGNOSIS — R1314 Dysphagia, pharyngoesophageal phase: Secondary | ICD-10-CM | POA: Diagnosis present

## 2023-06-18 HISTORY — PX: THYROIDECTOMY: SHX17

## 2023-06-18 SURGERY — THYROIDECTOMY
Anesthesia: General | Site: Neck

## 2023-06-18 MED ORDER — PHENYLEPHRINE HCL-NACL 20-0.9 MG/250ML-% IV SOLN
INTRAVENOUS | Status: DC | PRN
  Administered 2023-06-18: 40 ug/min via INTRAVENOUS
  Administered 2023-06-18 (×2): 80 ug via INTRAVENOUS

## 2023-06-18 MED ORDER — ONDANSETRON HCL 4 MG/2ML IJ SOLN: 4.0000 mg | Freq: Once | INTRAMUSCULAR | Status: DC | PRN

## 2023-06-18 MED ORDER — DEXAMETHASONE SODIUM PHOSPHATE 10 MG/ML IJ SOLN
INTRAMUSCULAR | Status: AC
  Filled 2023-06-18: qty 1

## 2023-06-18 MED ORDER — ROCURONIUM BROMIDE 10 MG/ML (PF) SYRINGE
PREFILLED_SYRINGE | INTRAVENOUS | Status: AC
  Filled 2023-06-18: qty 10

## 2023-06-18 MED ORDER — GLYCOPYRROLATE 0.2 MG/ML IJ SOLN
INTRAMUSCULAR | Status: AC
  Filled 2023-06-18: qty 1

## 2023-06-18 MED ORDER — LIDOCAINE HCL (CARDIAC) PF 100 MG/5ML IV SOSY
PREFILLED_SYRINGE | INTRAVENOUS | Status: DC | PRN
  Administered 2023-06-18: 100 mg via INTRAVENOUS

## 2023-06-18 MED ORDER — KETAMINE HCL 50 MG/5ML IJ SOSY
PREFILLED_SYRINGE | INTRAMUSCULAR | Status: DC | PRN
  Administered 2023-06-18: 30 mg via INTRAVENOUS

## 2023-06-18 MED ORDER — REMIFENTANIL HCL 1 MG IV SOLR: INTRAVENOUS | Status: DC | PRN

## 2023-06-18 MED ORDER — FENTANYL CITRATE (PF) 100 MCG/2ML IJ SOLN: 25.0000 ug | INTRAMUSCULAR | Status: DC | PRN

## 2023-06-18 MED ORDER — CHLORHEXIDINE GLUCONATE 0.12 % MT SOLN
OROMUCOSAL | Status: AC
Start: 2023-06-18 — End: ?
  Filled 2023-06-18: qty 15

## 2023-06-18 MED ORDER — PROPOFOL 10 MG/ML IV BOLUS
INTRAVENOUS | Status: AC
  Filled 2023-06-18: qty 20

## 2023-06-18 MED ORDER — PROPOFOL 1000 MG/100ML IV EMUL
INTRAVENOUS | Status: AC
  Filled 2023-06-18: qty 100

## 2023-06-18 MED ORDER — MIDAZOLAM HCL 2 MG/2ML IJ SOLN
INTRAMUSCULAR | Status: AC
  Filled 2023-06-18: qty 2

## 2023-06-18 MED ORDER — TRAMADOL HCL 50 MG PO TABS: 50.0000 mg | ORAL_TABLET | Freq: Four times a day (QID) | ORAL | 0 refills | Status: AC | PRN

## 2023-06-18 MED ORDER — GLYCOPYRROLATE 0.2 MG/ML IJ SOLN
INTRAMUSCULAR | Status: DC | PRN
  Administered 2023-06-18: .2 mg via INTRAVENOUS

## 2023-06-18 MED ORDER — ORAL CARE MOUTH RINSE: 15.0000 mL | Freq: Once | OROMUCOSAL | Status: DC

## 2023-06-18 MED ORDER — BACITRACIN ZINC 500 UNIT/GM EX OINT
TOPICAL_OINTMENT | CUTANEOUS | Status: AC
  Filled 2023-06-18: qty 28.35

## 2023-06-18 MED ORDER — BUPIVACAINE-EPINEPHRINE (PF) 0.25% -1:200000 IJ SOLN
INTRAMUSCULAR | Status: AC
  Filled 2023-06-18: qty 30

## 2023-06-18 MED ORDER — LACTATED RINGERS IV SOLN: INTRAVENOUS | Status: DC

## 2023-06-18 MED ORDER — PHENYLEPHRINE 80 MCG/ML (10ML) SYRINGE FOR IV PUSH (FOR BLOOD PRESSURE SUPPORT)
PREFILLED_SYRINGE | INTRAVENOUS | Status: AC
  Filled 2023-06-18: qty 10

## 2023-06-18 MED ORDER — ONDANSETRON HCL 4 MG/2ML IJ SOLN
INTRAMUSCULAR | Status: AC
  Filled 2023-06-18: qty 2

## 2023-06-18 MED ORDER — OXYCODONE HCL 5 MG/5ML PO SOLN: 5.0000 mg | Freq: Once | ORAL | Status: DC | PRN

## 2023-06-18 MED ORDER — CHLORHEXIDINE GLUCONATE 0.12 % MT SOLN: 15.0000 mL | Freq: Once | OROMUCOSAL | Status: DC

## 2023-06-18 MED ORDER — PROPOFOL 10 MG/ML IV BOLUS
INTRAVENOUS | Status: DC | PRN
  Administered 2023-06-18: 150 ug/kg/min via INTRAVENOUS
  Administered 2023-06-18: 200 mg via INTRAVENOUS

## 2023-06-18 MED ORDER — ONDANSETRON HCL 4 MG/2ML IJ SOLN
INTRAMUSCULAR | Status: DC | PRN
  Administered 2023-06-18: 4 mg via INTRAVENOUS

## 2023-06-18 MED ORDER — HYDROMORPHONE HCL 1 MG/ML IJ SOLN: 0.2500 mg | INTRAMUSCULAR | Status: DC | PRN

## 2023-06-18 MED ORDER — BUPIVACAINE-EPINEPHRINE (PF) 0.25% -1:200000 IJ SOLN
INTRAMUSCULAR | Status: DC | PRN
  Administered 2023-06-18: 7 mL

## 2023-06-18 MED ORDER — DEXAMETHASONE SODIUM PHOSPHATE 10 MG/ML IJ SOLN
INTRAMUSCULAR | Status: DC | PRN
  Administered 2023-06-18: 10 mg via INTRAVENOUS

## 2023-06-18 MED ORDER — SODIUM CHLORIDE (PF) 0.9 % IJ SOLN
INTRAMUSCULAR | Status: AC
  Filled 2023-06-18: qty 20

## 2023-06-18 MED ORDER — OXYCODONE HCL 5 MG PO TABS: 5.0000 mg | ORAL_TABLET | Freq: Once | ORAL | Status: DC | PRN

## 2023-06-18 MED ORDER — REMIFENTANIL HCL 1 MG IV SOLR
INTRAVENOUS | Status: DC | PRN
  Administered 2023-06-18: .1 ug/kg/min via INTRAVENOUS

## 2023-06-18 MED ORDER — REMIFENTANIL HCL 1 MG IV SOLR
INTRAVENOUS | Status: AC
  Filled 2023-06-18: qty 1000

## 2023-06-18 MED ORDER — MIDAZOLAM HCL 2 MG/2ML IJ SOLN
INTRAMUSCULAR | Status: DC | PRN
  Administered 2023-06-18: 2 mg via INTRAVENOUS

## 2023-06-18 MED ORDER — ONDANSETRON HCL 4 MG PO TABS: 4.0000 mg | ORAL_TABLET | Freq: Three times a day (TID) | ORAL | 0 refills | Status: AC | PRN

## 2023-06-18 MED ORDER — KETAMINE HCL 50 MG/5ML IJ SOSY
PREFILLED_SYRINGE | INTRAMUSCULAR | Status: AC
Start: 2023-06-18 — End: ?
  Filled 2023-06-18: qty 5

## 2023-06-18 MED ORDER — ACETAMINOPHEN 10 MG/ML IV SOLN: 1000.0000 mg | Freq: Once | INTRAVENOUS | Status: DC | PRN

## 2023-06-18 MED ORDER — FENTANYL CITRATE (PF) 100 MCG/2ML IJ SOLN
INTRAMUSCULAR | Status: AC
Start: 2023-06-18 — End: ?
  Filled 2023-06-18: qty 2

## 2023-06-18 MED ORDER — 0.9 % SODIUM CHLORIDE (POUR BTL) OPTIME
TOPICAL | Status: DC | PRN
  Administered 2023-06-18: 500 mL

## 2023-06-18 MED ORDER — FENTANYL CITRATE (PF) 100 MCG/2ML IJ SOLN
INTRAMUSCULAR | Status: DC | PRN
  Administered 2023-06-18: 100 ug via INTRAVENOUS

## 2023-06-18 SURGICAL SUPPLY — 30 items
BLADE SURG 15 STRL LF DISP TIS (BLADE) ×2 IMPLANT
CLEANER CAUTERY TIP PAD (MISCELLANEOUS) ×2 IMPLANT
CORD BIP STRL DISP 12FT (MISCELLANEOUS) ×2 IMPLANT
DERMABOND ADVANCED .7 DNX12 (GAUZE/BANDAGES/DRESSINGS) IMPLANT
ELECT LARYNGEAL DUAL CHAN (ELECTRODE) ×2 IMPLANT
ELECTRODE NDL 20X.3 GREEN (MISCELLANEOUS) ×2 IMPLANT
ELECTRODE NEEDLE 20X.3 GREEN (MISCELLANEOUS) ×1 IMPLANT
ELECTRODE REM PT RTRN 9FT ADLT (ELECTROSURGICAL) ×2 IMPLANT
FORCEPS JEWEL BIP 4-3/4 STR (INSTRUMENTS) ×2 IMPLANT
GAUZE 4X4 16PLY ~~LOC~~+RFID DBL (SPONGE) ×2 IMPLANT
GLOVE BIO SURGEON STRL SZ7.5 (GLOVE) ×4 IMPLANT
GOWN STRL REUS W/ TWL LRG LVL3 (GOWN DISPOSABLE) ×6 IMPLANT
HEMOSTAT SURGICEL 2X3 (HEMOSTASIS) ×2 IMPLANT
KIT TURNOVER KIT A (KITS) ×2 IMPLANT
LABEL OR SOLS (LABEL) ×2 IMPLANT
MANIFOLD NEPTUNE II (INSTRUMENTS) ×2 IMPLANT
NS IRRIG 500ML POUR BTL (IV SOLUTION) ×2 IMPLANT
PACK HEAD/NECK (MISCELLANEOUS) ×2 IMPLANT
PAD MAGNETIC INSTR ST 16X20 (MISCELLANEOUS) ×2 IMPLANT
PROBE NEUROSIGN BIPOL (MISCELLANEOUS) ×2 IMPLANT
SHEARS HARMONIC 9CM CVD (BLADE) ×2 IMPLANT
SOLUTION PREP PVP 2OZ (MISCELLANEOUS) ×2 IMPLANT
SPONGE KITTNER 5P (MISCELLANEOUS) ×2 IMPLANT
STRIP CLOSURE SKIN 1/2X4 (GAUZE/BANDAGES/DRESSINGS) IMPLANT
STRIP CLOSURE SKIN 1/4X4 (GAUZE/BANDAGES/DRESSINGS) IMPLANT
SUT PROLENE 6 0 P 1 18 (SUTURE) ×2 IMPLANT
SUT SILK 2 0 SH (SUTURE) ×2 IMPLANT
SUT SILK 2-0 18XBRD TIE 12 (SUTURE) ×2 IMPLANT
SUT VIC AB 4-0 RB1 18 (SUTURE) ×2 IMPLANT
TRAP FLUID SMOKE EVACUATOR (MISCELLANEOUS) ×2 IMPLANT

## 2023-06-18 NOTE — Progress Notes (Signed)
 Dr. [name] came in to speak with the patient.

## 2023-06-18 NOTE — Anesthesia Preprocedure Evaluation (Addendum)
 Anesthesia Evaluation  Patient identified by MRN, date of birth, ID band Patient awake  General Assessment Comment: During her PAT interview, it was noted that patient has a (+) familial history of MALIGNANT HYPERTHERMIA in multiple 1st and 2nd degree relatives (mother, daughter, niece). She notes that testing was attempted in 2006 during her cholecystectomy. Apparently the sample that was taken from her abdomen for the biopsy did not muscle tissue, thus testing was "inconclusive".  Despite having multiple family members who have suffered the effects of this anesthetic complication, patient refuses to allow muscle biopsy to be repeated for confirmatory caffeine halothane contracture testing (CHCT).   Patient [facility] Melven Stable Evinger) has never personally experienced any issues related to the use of general anesthesia per her report.  Within the [facility] system, patient has undergone a routine colonoscopy at [facility] with no documented complications; MAC anesthetic course using propofol  only. Patient has never received anesthesia services here at [facility]. All of her previous actual surgical procedures have been in [place] .      Reviewed: Allergy & Precautions, NPO status , Patient's Chart, lab work & pertinent test results  History of Anesthesia Complications (+) MALIGNANT HYPERTHERMIA, Family history of anesthesia reaction and history of anesthetic complications  Airway Mallampati: II  TM Distance: >3 FB Neck ROM: full    Dental  (+) Edentulous Upper, Missing   Pulmonary Current Smoker and Patient abstained from smoking.   Pulmonary exam normal        Cardiovascular hypertension, On Medications Normal cardiovascular exam     Neuro/Psych  Headaches  negative psych ROS   GI/Hepatic Neg liver ROS,GERD  ,,  Endo/Other  negative endocrine ROS    Renal/GU negative Renal ROS      Musculoskeletal   Abdominal   Peds  Hematology negative hematology ROS (+)   Anesthesia Other Findings Past Medical History: 10/26/2015: Basal cell carcinoma     Comment:  nod-Left thigh No date: Cigarette smoker No date: Complication of anesthesia No date: Family history of adverse reaction to anesthesia     Comment:  a.) (+) FHx of MH in 1st and 2nd degree female relatives              (mother, daughter, and neice); patient tested in 2006               during cholecystectomy, however Bx sample taken from               abdomen did not contain muscle tissue for CHCT testing               and was thus inconclusive; refuses to allow repeat Bx for              repeat CHCT testing No date: GERD (gastroesophageal reflux disease) No date: Hematochezia No date: History of colon polyps No date: History of kidney stones No date: Hyperlipidemia No date: Hypertension No date: Malignant hyperthermia     Comment:  a.) (+) FHx of MH in 1st and 2nd degree female relatives              (mother, daughter, and neice); patient tested in 2006               during cholecystectomy, however Bx sample taken from               abdomen did not contain muscle tissue for CHCT testing  and was thus inconclusive; refuses to allow repeat Bx for              repeat CHCT testing No date: Migraines 1992: Ovarian cancer (HCC)     Comment:  a.) s/p TAH and oophorectomy No date: Thyroid  nodule No date: Vitamin D deficiency  Past Surgical History: 1983: APPENDECTOMY     Comment:  open 2006: cholecsytectomy 2008: COLONOSCOPY 04/08/2017: COLONOSCOPY WITH PROPOFOL ; N/A     Comment:  Procedure: COLONOSCOPY WITH PROPOFOL ;  Surgeon: Ozell Blunt, MD;  Location: [facility];  Service: Endoscopy;                Laterality: N/A; yrs ago: ESOPHAGOGASTRODUODENOSCOPY No date: LAPAROSCOPIC CHOLECYSTECTOMY; N/A 1992: TOTAL ABDOMINAL HYSTERECTOMY     Comment:  ooporectomy  BMI    Body  Mass Index: 31.62 kg/m      Reproductive/Obstetrics negative OB ROS                             Anesthesia Physical Anesthesia Plan  ASA: 2  Anesthesia Plan: General ETT   Post-op Pain Management: Toradol IV (intra-op)*, Ofirmev IV (intra-op)* and Dilaudid IV   Induction: Intravenous  PONV Risk Score and Plan: 3 and Ondansetron , Dexamethasone, Midazolam, Treatment may vary due to age or medical condition, Propofol  infusion and TIVA  Airway Management Planned: Oral ETT  Additional Equipment:   Intra-op Plan:   Post-operative Plan: Extubation in OR  Informed Consent: I have reviewed the patients History and Physical, chart, labs and discussed the procedure including the risks, benefits and alternatives for the proposed anesthesia with the patient or authorized representative who has indicated his/her understanding and acceptance.     Dental Advisory Given  Plan Discussed with: Anesthesiologist, CRNA and Surgeon  Anesthesia Plan Comments: (Patient consented for risks of anesthesia including but not limited to:  - adverse reactions to medications - damage to eyes, teeth, lips or other oral mucosa - nerve damage due to positioning  - sore throat or hoarseness - Damage to heart, brain, nerves, lungs, other parts of body or loss of life  Patient voiced understanding and assent.)        Anesthesia Quick Evaluation

## 2023-06-18 NOTE — Anesthesia Postprocedure Evaluation (Signed)
 Anesthesia Post Note  Patient: Brenda [name]  Procedure(s) Performed: THYROID  ISTHMUSECTOMY (Neck)  Patient location during evaluation: PACU Anesthesia Type: General Level of consciousness: awake and alert, oriented and patient cooperative Pain management: pain level controlled Vital Signs Assessment: post-procedure vital signs reviewed and stable Respiratory status: spontaneous breathing, nonlabored ventilation and respiratory function stable Cardiovascular status: blood pressure returned to baseline and stable Postop Assessment: adequate PO intake Anesthetic complications: no   No notable events documented.   Last Vitals:  Vitals:   06/18/23 0945 06/18/23 1014  BP: 133/70 (!) 154/81  Pulse: 62 65  Resp: 14 18  Temp:  (!) 36.4 C  SpO2: 95% 98%    Last Pain:  Vitals:   06/18/23 1014  TempSrc: Temporal  PainSc: 0-No pain                 Shanae Luo

## 2023-06-18 NOTE — Op Note (Signed)
....  06/18/2023  8:46 AM    [name], [name]  [id number]   Pre-Op Dx: THYROID  NODULE  GLOBUS PHARYNGUS DYSPHAGIA  Post-op Dx: SAME  Proc: Thyroid  isthmusectomy with Laryngeal Nerve Monitoring  Surg:  Rogers Clayman  Assistant:  McQueen  Anes:  GOT  EBL:  <10ccs  Comp:  None   Indications: Midline thyroid  nodule in isthmus with globus symptoms, benign FNA.  Strong family history of malignant hyperthermia  Findings: 2cm midline thyroid  isthmus nodule removed via thyroid  isthmusectomy  Description of Procedure:  After the patient was identified in hold and the history and physical and consent was reviewed and updated.  The patient was marked in an upright position on the anterior neck along a natural occuring skin crease.  The patient was next taken to the operating room and placed in a supine position.  General endotracheal anesthesia was induced with laryngeal monitor endotracheal tube.  Direct visualization by the surgeon of the tube electrodes in contact with the vocal cords was made..  The patient's anterior marked neck crease was neck injected with 7cc's of 0.5% marcaine with 1:200,000 Epinephrine.  The patient was next prepped and draped in a sterile normal fashion.  At this time, a 15 blade scalpel was used to make a skin incision along a previously marked anterior neck crease.  Dissection was carefully performed through the subcutaneous tissues with combination of Bovie electrocautery and blunt dissection.  The platysma was incised and anterior neck veins ligated with harmonic scalpel.  The median raphe of the strap muscles was divided in a linear fashion with Bovie electrocautery until the anterior border of the thyroid  gland was identified.     Attention at this time was directed to the patient's thyroid  nodule.  . The sternohyoid muscle was bluntly dissected away from the anterior thyroid  bilaterally.  This demonstrated a 2cm thyroid  nodule in the midline.  The cricoid  was identified and the trachea was dissected away from overlying thyroid  isthmus with a kelley clamp.  The lateral border of the isthmus nodule was identified and noted to be anterior and far away from insertion of the right recurrent laryngeal nerve.  Careful dissection with harmonic scalpel was used to divide the thyroid  isthmus just on the right lateral to the nodule.  Once the right side of the nodule was separated from the remaining thyroid  lobe dissection along the trachea and inferior thyroid  isthmus was made.  This demonstrated the left lateral border of the thyroid  nodule to be at midline and this was divided with harmonic scalpel.  The thyroid  isthmus and mass was then passed off the table for permanent pathological evaluation.    The wound was copiously irrigated with sterile saline.  Meticulous hemostasis with bipolar was obtained.  Surgicel was placed in the wound bed.  The strap muscles were re-approximated with a single figure 8 vicryl stitch.    The wound was then closed in a multilayered fashion with vicryl for subcutaneous tissues Dermabond topped with a steri-strip for skin closure.  At this time the patient was extubated and taken to PACU in good condition.  Plan: follow pathology.  Limit activity for 2 weeks.  Follow up in 1 week.  Brenda [name] Brenda [name]  06/18/2023 8:46 AM

## 2023-06-18 NOTE — Anesthesia Procedure Notes (Addendum)
 Procedure Name: Intubation Date/Time: 06/18/2023 7:48 AM  Performed by: Ellwood Haber, CRNAPre-anesthesia Checklist: Patient identified, Patient being monitored, Timeout performed, Emergency Drugs available and Suction available Patient Re-evaluated:Patient Re-evaluated prior to induction Oxygen Delivery Method: Circle system utilized Preoxygenation: Pre-oxygenation with 100% oxygen Induction Type: IV induction Ventilation: Mask ventilation without difficulty Laryngoscope Size: 3 and McGrath Grade View: Grade I Tube type: Oral Tube size: 7.0 mm Number of attempts: 1 Airway Equipment and Method: Stylet Placement Confirmation: ETT inserted through vocal cords under direct vision, positive ETCO2 and breath sounds checked- equal and bilateral Secured at: 22 cm Tube secured with: Tape Dental Injury: Teeth and Oropharynx as per pre-operative assessment  Comments: Smooth atraumatic intubation, no complications noted. Laryngeal surface electrode attached to exterior of tube for nerve monitoring as per Dr. Donnie Galea. Dr. Donnie Galea present during intubation, and was satisfied with tube placement for nerve monitoring.

## 2023-06-18 NOTE — Transfer of Care (Signed)
 Immediate Anesthesia Transfer of Care Note  Patient: Brenda [name]  Procedure(s) Performed: THYROIDECTOMY ISTHMUSECTOMY (Neck)  Patient Location: PACU  Anesthesia Type:General  Level of Consciousness: drowsy  Airway & Oxygen Therapy: Patient Spontanous Breathing and Patient connected to face mask oxygen  Post-op Assessment: Report given to RN and Post -op Vital signs reviewed and stable  Post vital signs: Reviewed and stable  Last Vitals:  Vitals Value Taken Time  BP 114/60 06/18/23 0915  Temp 36.4 C 06/18/23 0915  Pulse 79 06/18/23 0920  Resp 17 06/18/23 0920  SpO2 98 % 06/18/23 0920  Vitals shown include unfiled device data.  Last Pain:  Vitals:   06/18/23 0618  TempSrc: Temporal  PainSc: 0-No pain         Complications: No notable events documented.

## 2023-06-18 NOTE — H&P (Signed)
..  History and Physical paper copy reviewed and updated date of procedure and will be scanned into system.  Patient seen and examined and marked.  Family history of MH.

## 2023-06-19 ENCOUNTER — Encounter: Payer: Self-pay | Admitting: Otolaryngology

## 2023-06-20 LAB — SURGICAL PATHOLOGY

## 2023-10-16 IMAGING — MG MM DIGITAL SCREENING BILAT W/ TOMO AND CAD
8 series · 8 of 24 positions shown · non-contrast
Comparison: None Available.
COMPARISON: None Available.

Addendum:
CLINICAL DATA: Screening.

EXAM:
DIGITAL SCREENING BILATERAL MAMMOGRAM WITH TOMOSYNTHESIS AND CAD
TECHNIQUE: Bilateral screening digital craniocaudal and mediolateral oblique
mammograms were obtained. Bilateral screening digital breast
tomosynthesis was performed. The images were evaluated with
computer-aided detection.

[R MLO synth-2D]
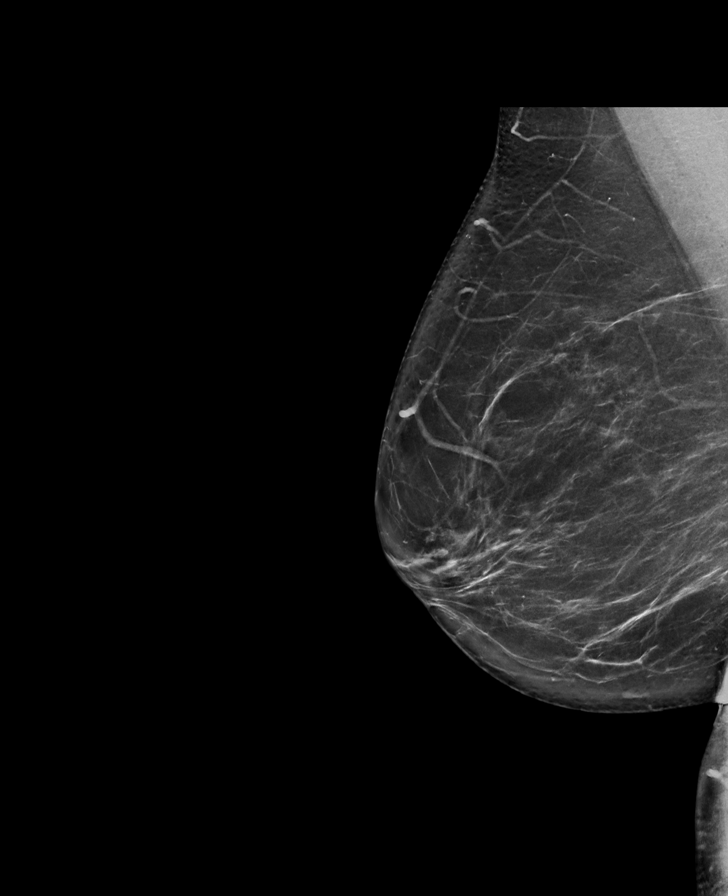

[L MLO synth-2D]
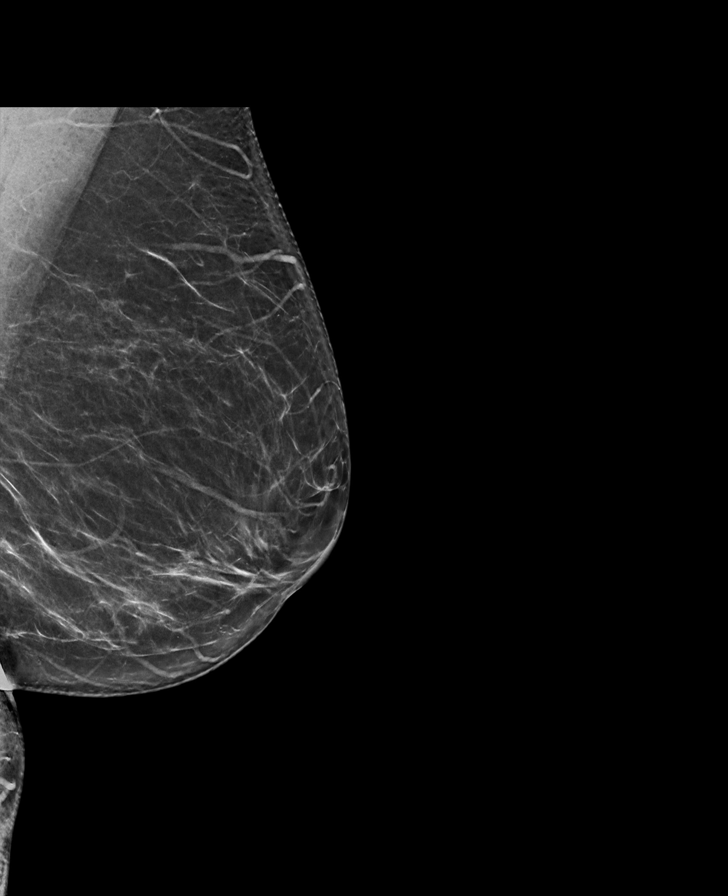

[L CC synth-2D]
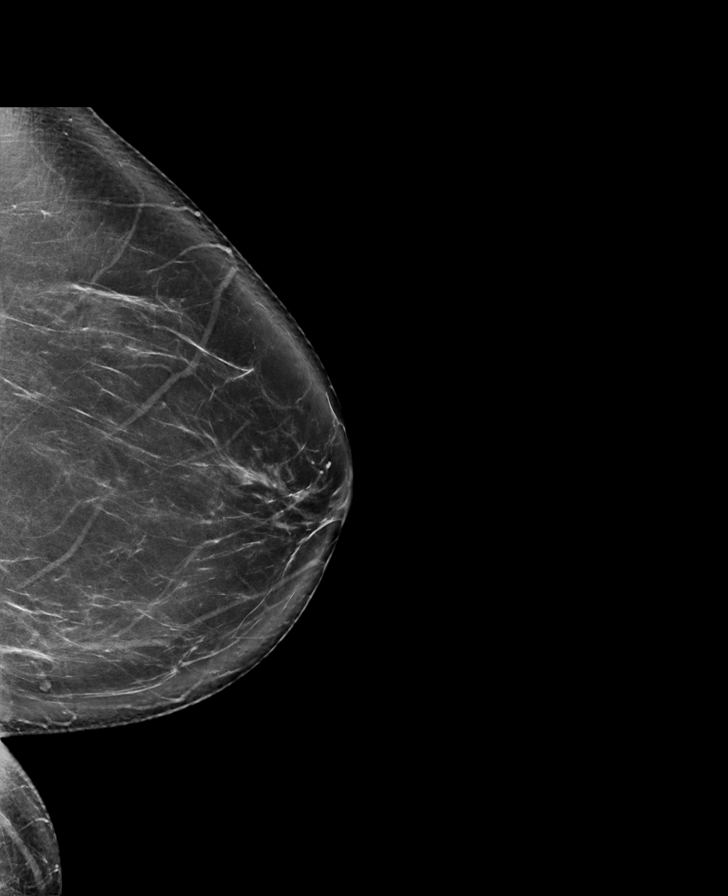

[R CC synth-2D]
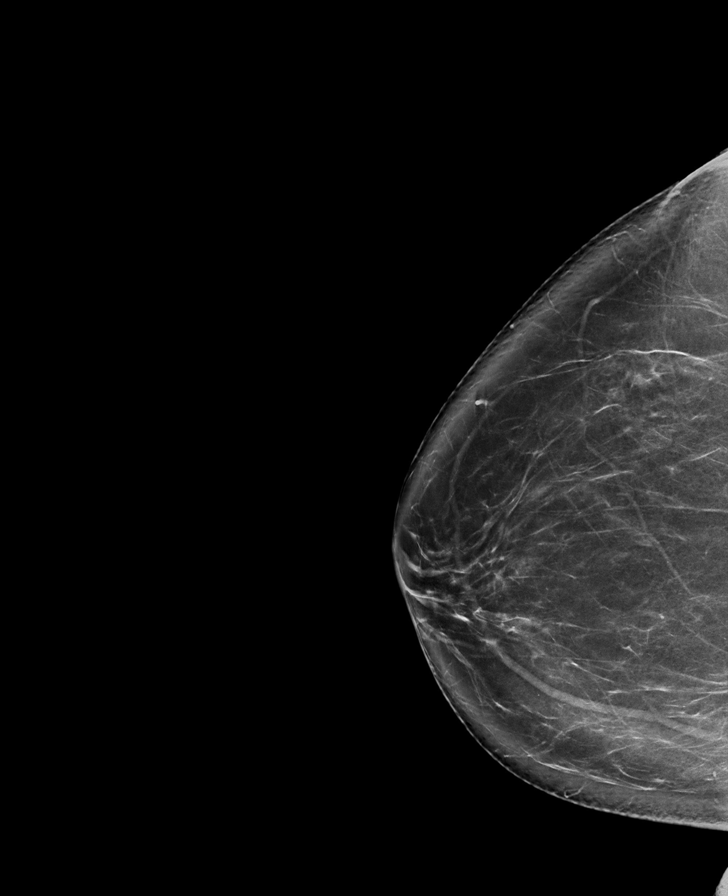

[L CC tomo · tomo slice 47/92.0]
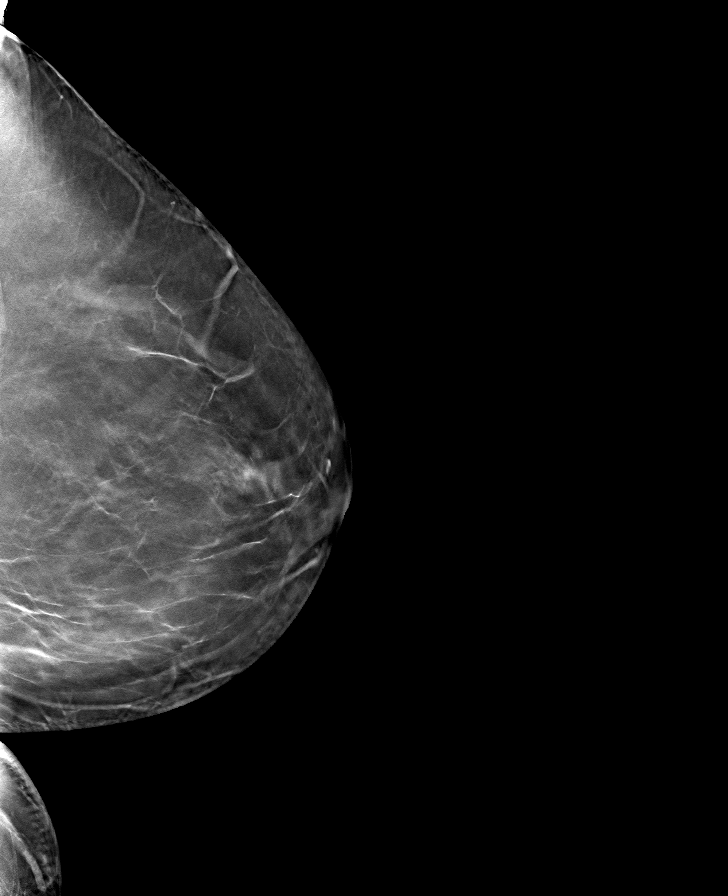

[L MLO tomo · tomo slice 42/83.0]
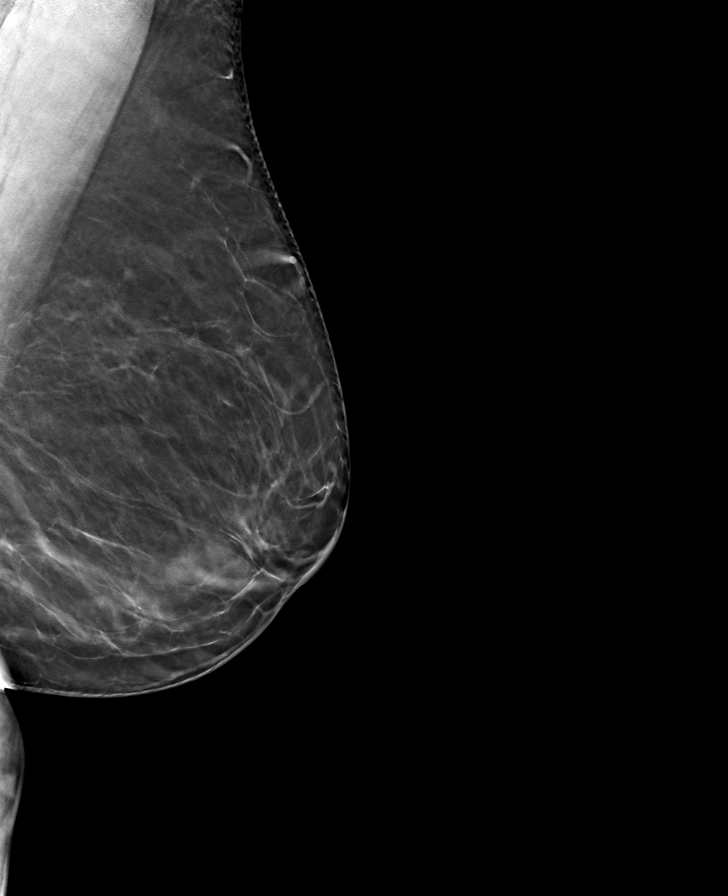

[R MLO tomo · tomo slice 45/88.0]
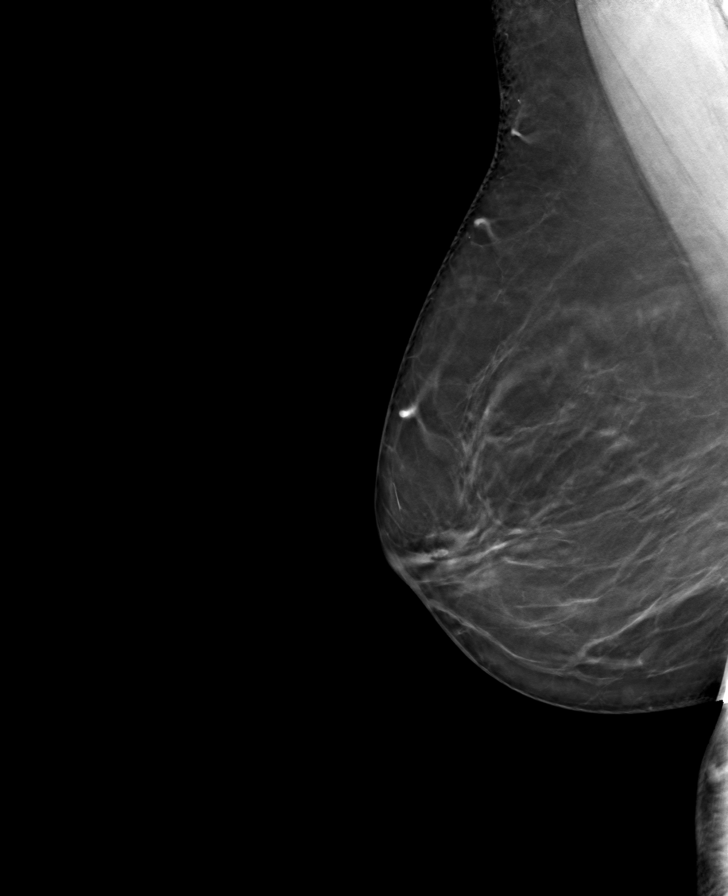

[R CC tomo · tomo slice 45/90.0]
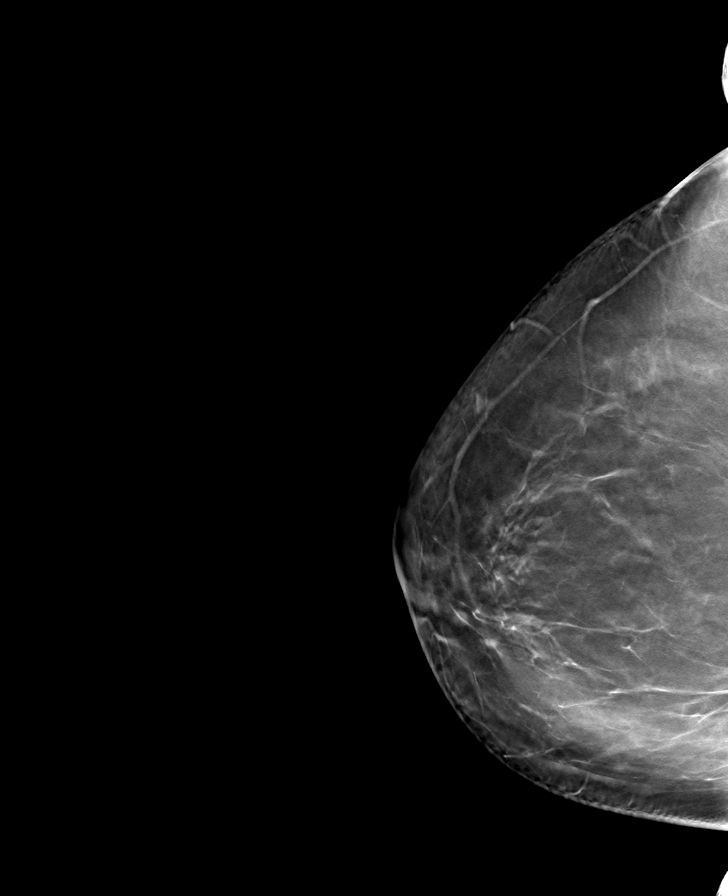

[8 of 24 positions shown; findings below may reference images not displayed]

ACR Breast Density Category b: There are scattered areas of
fibroglandular density.
FINDINGS: There are no findings suspicious for malignancy.
IMPRESSION: No mammographic evidence of malignancy. A result letter of this
screening mammogram will be mailed directly to the patient.

RECOMMENDATION:
Screening mammogram in one year. (Code:JT-Y-M4D)

BI-RADS CATEGORY  1: Negative.

ADDENDUM:
Comparison is made with multiple prior studies.

*** End of Addendum ***
ACR Breast Density Category b: There are scattered areas of
fibroglandular density.
FINDINGS: There are no findings suspicious for malignancy.
IMPRESSION: No mammographic evidence of malignancy. A result letter of this
screening mammogram will be mailed directly to the patient.

RECOMMENDATION:
Screening mammogram in one year. (Code:JT-Y-M4D)

BI-RADS CATEGORY  1: Negative.
